# Patient Record
Sex: Female | Born: 1970 | Race: Black or African American | Hispanic: No | Marital: Single | Smoking: Never smoker
Health system: Southern US, Community
[De-identification: ages and names within clinical notes are randomized; demographics above are authoritative.]

## PROBLEM LIST (undated history)

## (undated) DIAGNOSIS — K649 Unspecified hemorrhoids: Secondary | ICD-10-CM

## (undated) DIAGNOSIS — R87619 Unspecified abnormal cytological findings in specimens from cervix uteri: Secondary | ICD-10-CM

## (undated) DIAGNOSIS — N946 Dysmenorrhea, unspecified: Secondary | ICD-10-CM

## (undated) DIAGNOSIS — B977 Papillomavirus as the cause of diseases classified elsewhere: Secondary | ICD-10-CM

## (undated) DIAGNOSIS — IMO0002 Reserved for concepts with insufficient information to code with codable children: Secondary | ICD-10-CM

## (undated) DIAGNOSIS — B009 Herpesviral infection, unspecified: Secondary | ICD-10-CM

## (undated) DIAGNOSIS — B9689 Other specified bacterial agents as the cause of diseases classified elsewhere: Secondary | ICD-10-CM

## (undated) DIAGNOSIS — A599 Trichomoniasis, unspecified: Secondary | ICD-10-CM

## (undated) DIAGNOSIS — N760 Acute vaginitis: Secondary | ICD-10-CM

## (undated) DIAGNOSIS — N9089 Other specified noninflammatory disorders of vulva and perineum: Secondary | ICD-10-CM

## (undated) DIAGNOSIS — N764 Abscess of vulva: Secondary | ICD-10-CM

## (undated) DIAGNOSIS — D219 Benign neoplasm of connective and other soft tissue, unspecified: Secondary | ICD-10-CM

## (undated) DIAGNOSIS — N920 Excessive and frequent menstruation with regular cycle: Secondary | ICD-10-CM

## (undated) HISTORY — DX: Benign neoplasm of connective and other soft tissue, unspecified: D21.9

## (undated) HISTORY — PX: TUBAL LIGATION: SHX77

## (undated) HISTORY — DX: Papillomavirus as the cause of diseases classified elsewhere: B97.7

## (undated) HISTORY — DX: Excessive and frequent menstruation with regular cycle: N92.0

## (undated) HISTORY — DX: Unspecified abnormal cytological findings in specimens from cervix uteri: R87.619

## (undated) HISTORY — DX: Dysmenorrhea, unspecified: N94.6

## (undated) HISTORY — DX: Other specified bacterial agents as the cause of diseases classified elsewhere: N76.0

## (undated) HISTORY — DX: Unspecified hemorrhoids: K64.9

## (undated) HISTORY — DX: Abscess of vulva: N76.4

## (undated) HISTORY — DX: Reserved for concepts with insufficient information to code with codable children: IMO0002

## (undated) HISTORY — DX: Herpesviral infection, unspecified: B00.9

## (undated) HISTORY — DX: Other specified bacterial agents as the cause of diseases classified elsewhere: B96.89

## (undated) HISTORY — DX: Trichomoniasis, unspecified: A59.9

## (undated) HISTORY — DX: Other specified noninflammatory disorders of vulva and perineum: N90.89

---

## 1997-11-11 DIAGNOSIS — N764 Abscess of vulva: Secondary | ICD-10-CM

## 1997-11-11 HISTORY — DX: Abscess of vulva: N76.4

## 1998-02-08 ENCOUNTER — Ambulatory Visit (HOSPITAL_COMMUNITY): Admission: RE | Admit: 1998-02-08 | Discharge: 1998-02-08 | Payer: Self-pay | Admitting: Family Medicine

## 1998-11-11 DIAGNOSIS — K649 Unspecified hemorrhoids: Secondary | ICD-10-CM

## 1998-11-11 HISTORY — DX: Unspecified hemorrhoids: K64.9

## 1999-04-12 ENCOUNTER — Other Ambulatory Visit: Admission: RE | Admit: 1999-04-12 | Discharge: 1999-04-12 | Payer: Self-pay | Admitting: Obstetrics and Gynecology

## 2001-12-11 ENCOUNTER — Other Ambulatory Visit: Admission: RE | Admit: 2001-12-11 | Discharge: 2001-12-11 | Payer: Self-pay | Admitting: Obstetrics and Gynecology

## 2002-06-03 ENCOUNTER — Emergency Department (HOSPITAL_COMMUNITY): Admission: EM | Admit: 2002-06-03 | Discharge: 2002-06-03 | Payer: Self-pay

## 2003-02-28 ENCOUNTER — Emergency Department (HOSPITAL_COMMUNITY): Admission: EM | Admit: 2003-02-28 | Discharge: 2003-03-01 | Payer: Self-pay | Admitting: Emergency Medicine

## 2005-09-18 ENCOUNTER — Other Ambulatory Visit: Admission: RE | Admit: 2005-09-18 | Discharge: 2005-09-18 | Payer: Self-pay | Admitting: Obstetrics and Gynecology

## 2005-11-14 ENCOUNTER — Ambulatory Visit (HOSPITAL_COMMUNITY): Admission: RE | Admit: 2005-11-14 | Discharge: 2005-11-14 | Payer: Self-pay | Admitting: Obstetrics and Gynecology

## 2006-02-19 ENCOUNTER — Inpatient Hospital Stay (HOSPITAL_COMMUNITY): Admission: AD | Admit: 2006-02-19 | Discharge: 2006-02-19 | Payer: Self-pay | Admitting: Obstetrics and Gynecology

## 2006-04-01 ENCOUNTER — Encounter (INDEPENDENT_AMBULATORY_CARE_PROVIDER_SITE_OTHER): Payer: Self-pay | Admitting: *Deleted

## 2006-04-01 ENCOUNTER — Inpatient Hospital Stay (HOSPITAL_COMMUNITY): Admission: AD | Admit: 2006-04-01 | Discharge: 2006-04-04 | Payer: Self-pay | Admitting: Obstetrics and Gynecology

## 2006-11-11 HISTORY — PX: OTHER SURGICAL HISTORY: SHX169

## 2007-04-07 ENCOUNTER — Ambulatory Visit (HOSPITAL_COMMUNITY): Admission: RE | Admit: 2007-04-07 | Discharge: 2007-04-09 | Payer: Self-pay | Admitting: Obstetrics and Gynecology

## 2007-04-07 ENCOUNTER — Encounter (INDEPENDENT_AMBULATORY_CARE_PROVIDER_SITE_OTHER): Payer: Self-pay | Admitting: Obstetrics and Gynecology

## 2007-08-30 ENCOUNTER — Encounter: Admission: RE | Admit: 2007-08-30 | Discharge: 2007-08-30 | Payer: Self-pay | Admitting: Internal Medicine

## 2010-03-23 ENCOUNTER — Encounter: Admission: RE | Admit: 2010-03-23 | Discharge: 2010-03-23 | Payer: Self-pay | Admitting: Obstetrics and Gynecology

## 2010-09-01 ENCOUNTER — Encounter: Admission: RE | Admit: 2010-09-01 | Discharge: 2010-09-01 | Payer: Self-pay | Admitting: Internal Medicine
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
## 2011-03-29 NOTE — Discharge Summary (Signed)
NAMEPARLEE, Leslie Burton            ACCOUNT NO.:  [id number]   MEDICAL RECORD NO.:  [id number]          PATIENT TYPE:  OIB   LOCATION:  9303                          FACILITY:  [facility]   PHYSICIAN:  Hal Morales, M.D.DATE OF BIRTH:  1971/10/21   DATE OF ADMISSION:  04/07/2007  DATE OF DISCHARGE:  04/09/2007                               DISCHARGE SUMMARY   DISCHARGE DIAGNOSIS:  Symptomatic uterine fibroids and menorrhagia.   OPERATION:  On the day of admission the patient underwent a  laparoscopically-assisted vaginal hysterectomy with uterine  morcellation, tolerating procedure well.  The patient was found to have  a uterus enlarged to 12 weeks size with multiple myomata with normal-  appearing tubes and ovaries.   HISTORY OF PRESENT ILLNESS:  Leslie Burton is a 40 year old single  African American female para 2-0-1-2 who presents for a laparoscopically-  assisted vaginal hysterectomy because of symptomatic uterine fibroids of  menorrhagia.  Please see the patient's dictated history and physical  examination for details.   PREOPERATIVE PHYSICAL EXAM:  VITAL SIGNS:  Blood pressure 120/80, weight  is 200, height is 5 feet 3-1/2 inches tall.  GENERAL:  Exam was within normal limits.  ABDOMEN:  There was a tender firm mass arising from the pelvis  approximately three fingerbreadths above the symphysis pubis.  PELVIC:  EGBUS was within normal limits.  Vagina was rugose.  Cervix was  nontender without lesions.  Uterus appeared 10-12 weeks size, irregular,  firm and tender.  Adnexa with no tenderness or masses.  Rectovaginal  exam was without any palpable tenderness or masses.   HOSPITAL COURSE:  On the day of admission the patient underwent  aforementioned procedure tolerating it well.  However, postoperatively  the patient developed negative pressure pulmonary edema and was  therefore sent to ICU for observation.  By postop day #1 the patient's  negative pressure pulmonary edema had  appeared to resolve clinically and  the patient was tolerating clear liquids.  The patient continued to  progress so that post by postop day #2 she had resumed bowel and bladder  function and was therefore ready for discharge home.   DISCHARGE LABS:  White blood count 8.9, hemoglobin 10.3, hematocrit  29.8, and platelets 186.   DISCHARGE MEDICATIONS:  1. Ibuprofen 600 mg with food every 6 hours for 3 days, then as needed      for pain.  2. Percocet 5/325 1 tablet every 4 hours as needed for pain/  3. Iron sulfate 1 tablet daily.   FOLLOW UP:  The patient is to call [phone] to schedule a 6 weeks  postoperative visit with Dr. Pennie Rushing.   DISCHARGE INSTRUCTIONS:  The patient was advised to call if she develops  any problems, for any severe pain, excessive bleeding or for temperature  greater than 100.5 degrees.  She was further advised to avoid driving  for 2 weeks, sexual activity for 6 weeks and she may shower.  She may  walk up steps.  Her diet was without restriction.   FINAL PATHOLOGY:  Uterus hysterectomy:  Uterine cervix - benign  ectocervical and endocervical mucosa; uterine  corpus - benign  proliferative endometrium and benign myometrium with multiple intramural  leiomyomata.      Elmira J. Adline Peals.      Hal Morales, M.D.  Electronically Signed    EJP/MEDQ  D:  05/16/2007  T:  05/16/2007  Job:  [id number]

## 2011-03-29 NOTE — Discharge Summary (Signed)
Leslie Burton, Leslie Burton            ACCOUNT NO.:  [id number]   MEDICAL RECORD NO.:  [id number]          PATIENT TYPE:  INP   LOCATION:  9132                          [facility]ACILITY:  [facility]   PHYSICIAN:  Leslie A. Dillard, M.D. DATE OF BIRTH:  1971/01/31   DATE OF ADMISSION:  04/01/2006  DATE OF DISCHARGE:                                 DISCHARGE SUMMARY   ADMITTING DIAGNOSES:  1.  Intrauterine pregnancy at 39 weeks.  2.  Previous cesarean section with desire for repeat.  3.  Desires tubal sterilization.   DISCHARGE DIAGNOSES:  1.  Intrauterine pregnancy at 39 weeks.  2.  Previous cesarean section with desire for repeat and sterilization.   PROCEDURES:  1.  Repeat low transverse cesarean section.  2.  Bilateral tubal ligation.  3.  Spinal anesthesia.   HOSPITAL COURSE:  Leslie Burton is a 40 year old gravida 2, para 1-0-0-1 at  25 weeks who presented for repeat cesarean section and bilateral tubal  sterilization.  Pregnancy had been remarkable for:  1.  First trimester spotting.  2.  Advanced maternal age.  3.  Previous cesarean section with desire for repeat.  4.  Desiring sterilization.  5.  Uterine fibroids.   The patient was taken to the operating room where a repeat low transverse  cesarean section was performed by Dr. Dierdre Forth.  Findings were a  viable female by the name of Jasmine, weight 8 pounds 13 ounces, Apgars were  9 and 9.  The patient tolerated the procedure well and was taken to recovery  in good condition.  Infant was taken to the full-term nursing.  By  postoperative day #1 the patient was doing well, her hemoglobin was 10.6,  white blood cell count was 8.9, and platelets were 162.  She was bottle  feeding.  Her physical exam was within normal limits.  The rest of her  hospital course was uncomplicated.  By postoperative day #3 she was doing  well, she was tolerating a regular diet, she had good bowel sounds, she had  a question regarding a possible mole on  her perineum.  This was evaluated  and was found to be just a prominent hair follicle.  The patient was  reassured.  Her incision was clean, dry, and intact with subcuticular  sutures noted.  Her extremities were within normal limits.  She was deemed  to have received the full benefit of her hospital stay and was discharged  home.   DISCHARGE INSTRUCTIONS:  Per [facility] handout.   DISCHARGE MEDICATIONS:  1.  Motrin 600 mg p.o. q.6h. p.r.n. pain.  2.  Tylox one to two p.o. q.3-4h. p.r.n. pain.   DISCHARGE FOLLOW-UP:  Will occur in 6 weeks at [facility].      Leslie Burton, C.N.M.      Leslie Burton, M.D.  Electronically Signed    VLL/MEDQ  D:  04/04/2006  T:  04/04/2006  Job:  [id number]

## 2011-03-29 NOTE — Op Note (Signed)
Leslie Burton, Leslie Burton            ACCOUNT NO.:  [id number]   MEDICAL RECORD NO.:  [id number]          PATIENT TYPE:  OIB   LOCATION:  9399                          [facility]ACILITY:  [facility]   PHYSICIAN:  Hal Morales, M.D.DATE OF BIRTH:  [birth year]   DATE OF PROCEDURE:  04/07/2007  DATE OF DISCHARGE:                               OPERATIVE REPORT   PREOPERATIVE DIAGNOSIS:  Menorrhagia, uterine fibroids.   POSTOPERATIVE DIAGNOSIS:  Menorrhagia, uterine fibroids.   OPERATION:  Laparoscopically assisted vaginal hysterectomy.   SURGEON:  Hal Morales, M.D.   FIRST ASSISTANT:  Osborn Coho, M.D.   ANESTHESIA:  General orotracheal.   ESTIMATED BLOOD LOSS:  1100 mL.   COMPLICATIONS:  None.   FINDINGS:  The uterus was enlarged to approximately 12 weeks size with  multiple myomata.  The ovaries and tubes appeared within normal limits.   SPECIMENS TO PATHOLOGY:  Morcellated uterus and cervix.  The uterus  weighed 233 grams.   PROCEDURE:  The patient was taken to the operating room, after  appropriate identification, and placed on the operating table.  After  the attainment of adequate general anesthesia she was placed in the  modified lithotomy position.  The perineum and vagina as well as the  abdomen were prepped with multiple layers of Betadine.  A Foley catheter  was inserted into the bladder and connected to straight drainage.  A  weighted speculum was placed in the vagina and a single tooth tenaculum  placed on the anterior cervix.  A Rumi uterine manipulator of 10 cm was  placed in the uterus and the balloon inflated with 7 mL of saline.  The  abdomen and perineum were then draped as a sterile field.  Subumbilical  and suprapubic injections of 0.25% Marcaine for a total of 10 mL was  undertaken.   A subumbilical incision was made and a Veress cannula placed through  that incision into the peritoneal cavity.  A pneumoperitoneum was  created with 1.5 liters of CO2.  The  Veress cannula was removed and  laparoscopic trocar placed through that incision into the peritoneal  cavity.  The laparoscope was placed through the trocar sleeve.  Suprapubic incisions were made approximately 5 cm above the symphysis  pubis and approximately 8 cm from the midline.  Laparoscopic probe  trocars were placed through these incisions into the perineal cavity  under direct visualization.  The right round ligament was then  identified and cauterized and then cut.  The right utero-ovarian  ligament was identified, cauterized and cut.  The right fallopian tube  at its cornual insertion was then cauterized and cut and hemostasis  achieved with cautery.  A similar procedure was carried out with the  round ligament, fallopian tube, and utero-ovarian ligament on the left  side.  Copious irrigation was carried out and hemostasis noted to be  adequate.  The anterior leaf of the broad ligament was then incised from  the left to right to allow for creation of a bladder flap.  Sharp  dissection off the anterior cervix allowed for further creation of the  bladder flap.  Copious  irrigation was carried out and hemostasis was  noted to be adequate.  All instruments were removed, however, the trocar  sleeves were left in place and the hemoperitoneum allowed to escape.   The perineum was then the site of operation and the legs were elevated.  A weighted speculum was placed in the posterior vagina and Lahey  tenaculum placed on the anterior and posterior surfaces of the cervix.  The cervicovaginal mucosa was injected with a dilute solution of  Pitressin for a total of 40 mL.  The cervix was then circumscribed and  the anterior vaginal mucosa dissected with a combination of blunt and  sharp dissection off the anterior cervix.  The posterior cul-de-sac was  entered sharply and the uterosacral ligaments on the right and left side  clamped, cut, suture ligated and those sutures held.  A long  weighted  speculum was then placed in the posterior cul-de-sac and the  paracervical tissues and parametrial tissues successively clamped, cut  and suture ligated.  The uterine arteries were clamped, cut and suture  ligated.  At this time, the uterine fundus would not fit through into  the pelvis and morcellation of the uterus was undertaken to allow  progress up to the level of the uterine fundus where the upper pedicles  had already been disconnected during the laparoscopic portion of the  procedure.  All morcellated portions of the uterus were removed from the  operative field.  Hemostasis was achieved in the pelvis with figure-of-  eight sutures of 0 Vicryl in the right and left pelvic sidewall  incorporating the vaginal mucosa and the pelvic peritoneum.  Hemostasis  was achieved in the posterior cuff by oversewing the vaginal cuff.   A McCall's culdoplasty suture was then placed incorporating the  uterosacral ligaments on either side and the intervening posterior  peritoneum.  Once hemostasis was noted to be adequate, the vaginal  angles were created with the sutures that were held from the uterosacral  angles and placed in the anterior vaginal mucosa and posterior vaginal  mucosa then tied down on the angle.  The remainder of the vaginal cuff  was then closed with interrupted sutures of 0 Vicryl incorporating  anterior vaginal mucosa, anterior peritoneum, posterior peritoneum, and  posterior vaginal mucosa in figure-of-eight sutures.  Once the cuff was  completely closed, hemostasis was noted to be adequate.  The McCall  culdoplasty suture was tied down and the vagina packed with 2 inch  packing moistened with Estrace cream.   At this time, the surgeon changed gloves and went above to recreate the  abdominal pneumoperitoneum.  The site of surgery was then inspected and no areas of bleeding were noted.  Approximately 100 mL of saline was  left in the pelvis and all instruments were  removed from the peritoneal  cavity under direct visualization as the CO2 was allowed to escape.  The  subumbilical incision was closed with a fascial suture of 0 Vicryl in a  figure-of-eight fashion and a subcuticular skin suture.  This was 3-0  Vicryl.  The suprapubic incisions were closed with subcuticular sutures  of 3-0 Vicryl.  Sterile dressings were applied and the patient awakened  from general anesthesia and taken to the recovery room in satisfactory  condition having tolerated the procedure well with sponge and instrument  counts correct.   SPECIMENS TO PATHOLOGY:  Morcellated uterus and cervix.      Hal Morales, M.D.  Electronically Signed     VPH/MEDQ  D:  04/07/2007  T:  04/07/2007  Job:  [id number]

## 2011-03-29 NOTE — Op Note (Signed)
NAMEAVAH, Leslie Burton            ACCOUNT NO.:  [id number]   MEDICAL RECORD NO.:  [id number]          PATIENT TYPE:  INP   LOCATION:  9132                          [facility]ACILITY:  [facility]   PHYSICIAN:  Hal Morales, M.D.DATE OF BIRTH:  06/19/1971   DATE OF PROCEDURE:  04/01/2006  DATE OF DISCHARGE:                                 OPERATIVE REPORT   PREOPERATIVE DIAGNOSES:  Intrauterine pregnancy at term, prior cesarean  section, desire for repeat cesarean section, desire for surgical  sterilization.   POSTOPERATIVE DIAGNOSES:  Intrauterine pregnancy at term, prior cesarean  section, desire for repeat cesarean section, desire for surgical  sterilization.   OPERATION:  Repeat low transverse cesarean section, bilateral tubal  sterilization.   SURGEON:  Hal Morales, M.D.   FIRST ASSISTANT:  Rica Koyanagi, C.N.M.   ANESTHESIA:  Spinal.   ESTIMATED BLOOD LOSS:  150 mL.   COMPLICATIONS:  None.   FINDINGS:  The patient was delivered of a female infant weighing 8 pounds 13  ounces with Apgars of 9 and 9 at 1 and 5 minutes respectively.  The uterus,  tubes and ovaries appeared normal for the gravid state.   PROCEDURE:  The patient was taken to the operating room after appropriate  identification and placed on the operating table.  After the placement of a  spinal anesthetic, she was placed in the supine position with a left lateral  tilt.  The abdomen and perineum were prepped with multiple layers of  Betadine and a Foley catheter inserted into the bladder under sterile  conditions and connected to straight drainage.  The abdomen was draped in a  sterile field.  After the assurance of adequate anesthesia, the suprapubic  region at the site of the previous cesarean section incision was infiltrated  with 20 mL of 0.25% Marcaine.  The abdomen was incised at the site and the  abdomen opened in layers.  The peritoneum was entered and the bladder blade  placed.  The uterus was  incised approximately 2 cm above the uterovesical  fold and that incision taken laterally on either side bluntly.  The  membranes were ruptured with the egress of clear fluid.  The infant was  delivered from the occiput transverse position and after having the nares  and pharynx suctioned and the cord clamped and cut, was handed off to the  awaiting pediatricians.  The placenta was allowed to spontaneously separate  from the uterus and was removed from the operative field for the collection  of routine cord blood as well as public cord blood banking per the patient's  wishes.  The uterine incision was closed with a running interlocking suture  of 0 Vicryl.  An imbricating suture of 0 Vicryl was then placed.  Hemostatic  sutures of 0 Vicryl in a figure-of-eight pattern were placed for adequate  hemostasis.  Copious irrigation was carried out.  The left fallopian tube  was identified, followed to its fimbriated end, then grasped at the isthmic  portion and elevated.  A suture of 2-0 chromic was placed through the  mesosalpinx and tied fore and aft  on the intervening descent knuckle of  tube.  A second ligature was placed proximal to that and the intervening  knuckle of tube excised.  The cut ends were cauterized.  A similar procedure  was carried out on the opposite side.  The portions of tube were removed  from the operative field.  The abdominal peritoneum was then copiously  irrigated and noted to be hemostatic.  It was then closed with a running  suture of 2-0 Vicryl.  The rectus muscles were then reapproximated with a  figure-of-eight suture of 2-0 Vicryl.  The rectus fascia was closed with a  running suture of 0 Vicryl then reinforced on either side of the midline  with figure-of-eight sutures of 0 Vicryl.  The subcutaneous tissue was  copiously irrigated, made hemostatic with Bovie cautery, and noted to be  hemostatic.  The skin incision was then closed with a subcuticular suture of   3-0 Monocryl.  A sterile dressing was applied and the patient taken from the  operating room to the recovery room in satisfactory condition having  tolerated the procedure well with sponge and instrument counts correct.  The  infant went to the full-term nursery.      Hal Morales, M.D.  Electronically Signed     VPH/MEDQ  D:  04/01/2006  T:  04/01/2006  Job:  [id number]
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
## 2011-04-02 ENCOUNTER — Other Ambulatory Visit: Payer: Self-pay | Admitting: Obstetrics and Gynecology

## 2011-04-02 DIAGNOSIS — Z1231 Encounter for screening mammogram for malignant neoplasm of breast: Secondary | ICD-10-CM

## 2011-04-11 ENCOUNTER — Ambulatory Visit: Payer: Self-pay

## 2011-04-30 ENCOUNTER — Ambulatory Visit: Payer: Self-pay

## 2011-05-23 ENCOUNTER — Ambulatory Visit: Payer: Self-pay

## 2011-05-23 ENCOUNTER — Ambulatory Visit
Admission: RE | Admit: 2011-05-23 | Discharge: 2011-05-23 | Disposition: A | Discharge status: Home / Self Care | Payer: PRIVATE HEALTH INSURANCE | Source: Ambulatory Visit | Attending: Obstetrics and Gynecology | Admitting: Obstetrics and Gynecology

## 2011-05-23 DIAGNOSIS — Z1231 Encounter for screening mammogram for malignant neoplasm of breast: Secondary | ICD-10-CM

## 2011-07-07 ENCOUNTER — Emergency Department (HOSPITAL_COMMUNITY)
Admission: EM | Admit: 2011-07-07 | Discharge: 2011-07-07 | Discharge status: Against Medical Advice | Payer: PRIVATE HEALTH INSURANCE | Attending: Emergency Medicine | Admitting: Emergency Medicine

## 2011-07-07 DIAGNOSIS — R079 Chest pain, unspecified: Secondary | ICD-10-CM | POA: Insufficient documentation

## 2011-07-07 DIAGNOSIS — R51 Headache: Secondary | ICD-10-CM | POA: Insufficient documentation

## 2011-07-07 DIAGNOSIS — R42 Dizziness and giddiness: Secondary | ICD-10-CM | POA: Insufficient documentation

## 2012-01-27 ENCOUNTER — Encounter (INDEPENDENT_AMBULATORY_CARE_PROVIDER_SITE_OTHER): Payer: Self-pay

## 2012-01-29 ENCOUNTER — Ambulatory Visit (INDEPENDENT_AMBULATORY_CARE_PROVIDER_SITE_OTHER): Payer: PRIVATE HEALTH INSURANCE | Admitting: General Surgery

## 2012-01-29 ENCOUNTER — Encounter (INDEPENDENT_AMBULATORY_CARE_PROVIDER_SITE_OTHER): Payer: Self-pay | Admitting: General Surgery

## 2012-01-29 VITALS — BP 122/88 | HR 60 | Temp 97.6°F | Resp 16 | Ht 64.0 in | Wt 197.5 lb

## 2012-01-29 DIAGNOSIS — K648 Other hemorrhoids: Secondary | ICD-10-CM | POA: Insufficient documentation

## 2012-01-29 DIAGNOSIS — K644 Residual hemorrhoidal skin tags: Secondary | ICD-10-CM | POA: Insufficient documentation

## 2012-01-29 NOTE — Progress Notes (Signed)
Patient ID: [name], female   DOB: July 05, 1971, 41 y.o.   MRN: [id number]  Chief Complaint  Patient presents with  . New Evaluation    eval of hemorrhoids     HPI Leslie Burton is a 41 y.o. female.   HPI Patient has a long history of hemorrhoids. More recently, she developed blood in the stool for 3 days and are well. She had no significant pain in that area. She occasionally does get swelling. She has not had significant constipation recently. Past Medical History  Diagnosis Date  . Hemorrhoid     History reviewed. No pertinent past surgical history.  History reviewed. No pertinent family history.  Social History History  Substance Use Topics  . Smoking status: Never Smoker   . Smokeless tobacco: Never Used  . Alcohol Use: No    No Known Allergies  No current outpatient prescriptions on file.    Review of Systems Review of Systems  Constitutional: Negative for fever, chills and unexpected weight change.  HENT: Negative for hearing loss, congestion, sore throat, trouble swallowing and voice change.   Eyes: Negative for visual disturbance.  Respiratory: Negative for cough and wheezing.   Cardiovascular: Negative for chest pain, palpitations and leg swelling.  Gastrointestinal: Positive for blood in stool. Negative for nausea, vomiting, abdominal pain, diarrhea, constipation, abdominal distention and anal bleeding.       See history of present illness  Genitourinary: Negative for hematuria, vaginal bleeding and difficulty urinating.  Musculoskeletal: Negative for arthralgias.  Skin: Negative for rash and wound.  Neurological: Negative for seizures, syncope and headaches.  Hematological: Negative for adenopathy. Does not bruise/bleed easily.  Psychiatric/Behavioral: Negative for confusion.    Blood pressure 122/88, pulse 60, temperature 97.6 F (36.4 C), temperature source Temporal, resp. rate 16, height 5\' 4"  (1.626 m), weight 197 lb 8 oz (89.585  kg).  Physical Exam Physical Exam  Constitutional: She appears well-developed.  Cardiovascular: Normal rate and normal heart sounds.   Pulmonary/Chest: Effort normal and breath sounds normal. No respiratory distress.  Abdominal: Soft. Bowel sounds are normal. She exhibits no distension. There is no tenderness. There is no rebound and no guarding.       External anal exam reveals large skin tags. Digital rectal exam reveals internal hemorrhoid on the left side. Sclerosing solution was injected up above the sensate area on the left side of the rectal mucosa, she tolerated this with mild local discomfort  Skin: Skin is warm and dry.    Data Reviewed   Assessment    Anal skin tags and inflamed internal hemorrhoid    Plan    Internal hemorrhoid was injected as above. I encouraged her to drink plenty of water and avoid constipation. I will see her back in 6 weeks.       Samaiyah Howes E 01/29/2012, 11:10 AM

## 2012-03-18 ENCOUNTER — Encounter (INDEPENDENT_AMBULATORY_CARE_PROVIDER_SITE_OTHER): Payer: PRIVATE HEALTH INSURANCE | Admitting: General Surgery

## 2012-03-23 ENCOUNTER — Ambulatory Visit: Payer: Self-pay | Admitting: Obstetrics and Gynecology

## 2012-05-15 ENCOUNTER — Other Ambulatory Visit: Payer: Self-pay | Admitting: Obstetrics and Gynecology

## 2012-05-15 DIAGNOSIS — Z1231 Encounter for screening mammogram for malignant neoplasm of breast: Secondary | ICD-10-CM

## 2012-05-28 ENCOUNTER — Ambulatory Visit
Admission: RE | Admit: 2012-05-28 | Discharge: 2012-05-28 | Disposition: A | Discharge status: Home / Self Care | Payer: PRIVATE HEALTH INSURANCE | Source: Ambulatory Visit | Attending: Obstetrics and Gynecology | Admitting: Obstetrics and Gynecology

## 2012-05-28 DIAGNOSIS — Z1231 Encounter for screening mammogram for malignant neoplasm of breast: Secondary | ICD-10-CM

## 2012-06-04 ENCOUNTER — Encounter: Payer: Self-pay | Admitting: Obstetrics and Gynecology

## 2012-06-09 ENCOUNTER — Encounter: Payer: Self-pay | Admitting: Obstetrics and Gynecology

## 2012-06-09 ENCOUNTER — Ambulatory Visit (INDEPENDENT_AMBULATORY_CARE_PROVIDER_SITE_OTHER): Payer: PRIVATE HEALTH INSURANCE | Admitting: Obstetrics and Gynecology

## 2012-06-09 VITALS — BP 112/64 | Ht 64.0 in | Wt 198.0 lb

## 2012-06-09 DIAGNOSIS — N9089 Other specified noninflammatory disorders of vulva and perineum: Secondary | ICD-10-CM

## 2012-06-09 DIAGNOSIS — N760 Acute vaginitis: Secondary | ICD-10-CM

## 2012-06-09 DIAGNOSIS — A599 Trichomoniasis, unspecified: Secondary | ICD-10-CM

## 2012-06-09 DIAGNOSIS — R87619 Unspecified abnormal cytological findings in specimens from cervix uteri: Secondary | ICD-10-CM

## 2012-06-09 DIAGNOSIS — A499 Bacterial infection, unspecified: Secondary | ICD-10-CM

## 2012-06-09 DIAGNOSIS — N764 Abscess of vulva: Secondary | ICD-10-CM

## 2012-06-09 DIAGNOSIS — D219 Benign neoplasm of connective and other soft tissue, unspecified: Secondary | ICD-10-CM

## 2012-06-09 DIAGNOSIS — B977 Papillomavirus as the cause of diseases classified elsewhere: Secondary | ICD-10-CM

## 2012-06-09 DIAGNOSIS — R6889 Other general symptoms and signs: Secondary | ICD-10-CM

## 2012-06-09 DIAGNOSIS — IMO0002 Reserved for concepts with insufficient information to code with codable children: Secondary | ICD-10-CM | POA: Insufficient documentation

## 2012-06-09 DIAGNOSIS — B009 Herpesviral infection, unspecified: Secondary | ICD-10-CM

## 2012-06-09 DIAGNOSIS — B9689 Other specified bacterial agents as the cause of diseases classified elsewhere: Secondary | ICD-10-CM

## 2012-06-09 DIAGNOSIS — K649 Unspecified hemorrhoids: Secondary | ICD-10-CM

## 2012-06-09 DIAGNOSIS — N946 Dysmenorrhea, unspecified: Secondary | ICD-10-CM

## 2012-06-09 DIAGNOSIS — N920 Excessive and frequent menstruation with regular cycle: Secondary | ICD-10-CM

## 2012-06-09 DIAGNOSIS — N898 Other specified noninflammatory disorders of vagina: Secondary | ICD-10-CM

## 2012-06-09 DIAGNOSIS — D259 Leiomyoma of uterus, unspecified: Secondary | ICD-10-CM

## 2012-06-09 LAB — POCT WET PREP (WET MOUNT): Clue Cells Wet Prep Whiff POC: POSITIVE

## 2012-06-09 NOTE — Patient Instructions (Addendum)
[place] [employer] [place] rep[employer]resh

## 2012-06-09 NOTE — Progress Notes (Signed)
AEX VISIT  Last Pap: 01/06/2008 WNL: Yes Regular Periods:no Contraception: BTL / Hysterectomy  Monthly Breast exam:yes Tetanus<63yrs:no Nl.Bladder Function:yes Daily BMs:yes Healthy Diet:yes Calcium:no Mammogram:yes Date of Mammogram: 05/2012 Exercise:yes Have often Exercise: once weekly Seatbelt: yes Abuse at home: no Stressful work:yes Sigmoid-colonoscopy: n/a Bone Density: No PCP: [facility]  Change in PMH: None Change in [facility]: None  Subjective:    Leslie Burton is a 41 y.o. female, [id number], who presents for an annual exam. She is without complaints    History   Social History  . Marital Status: Single    Spouse Name: N/A    Number of Children: N/A  . Years of Education: N/A   Social History Main Topics  . Smoking status: Never Smoker   . Smokeless tobacco: Never Used  . Alcohol Use: No  . Drug Use: No  . Sexually Active: Yes    Birth Control/ Protection: Surgical   Other Topics Concern  . None   Social History Narrative  . None    Menstrual cycle:   LMP: No LMP recorded. Patient has had a hysterectomy.           Cycle: none s/p hysterectomy  The following portions of the patient's history were reviewed and updated as appropriate: allergies, current medications, past family history, past medical history, past social history, past surgical history and problem list.  Review of Systems Pertinent items are noted in HPI. Breast:Negative for breast lump,nipple discharge or nipple retraction Gastrointestinal: Negative for abdominal pain, change in bowel habits or rectal bleeding Urinary:negative   Objective:    BP 112/64  Ht 5\' 4"  (1.626 m)  Wt 198 lb (89.812 kg)  BMI 33.99 kg/m2    Weight:  Wt Readings from Last 1 Encounters:  06/09/12 198 lb (89.812 kg)          BMI: Body mass index is 33.99 kg/(m^2).  General Appearance: Alert, appropriate appearance for age. No acute distress HEENT: Grossly normal Neck / Thyroid: Supple, no  masses, nodes or enlargement Lungs: clear to auscultation bilaterally Back: No CVA tenderness Breast Exam: No masses or nodes.No dimpling, nipple retraction or discharge. Cardiovascular: Regular rate and rhythm. S1, S2, no murmur Gastrointestinal: Soft, non-tender, no masses or organomegaly Pelvic Exam: External genitalia: normal general appearance Vaginal: normal rugae, discharge, white and vaginal vault, well suspended Cervix: removed surgically Adnexa: no masses Uterus: removed surgically Rectovaginal: normal rectal, no masses Lymphatic Exam: Non-palpable nodes in neck, clavicular, axillary, or inguinal regions Skin: no rash or abnormalities Neurologic: Normal gait and speech, no tremor  Psychiatric: Alert and oriented, appropriate affect.   Wet Prep:positive clue cells, pH 5.0 and positive whiff test Urinalysis:not applicable UPT: Not done   Assessment:    Recurrent Bacterial vaginosos  , currently asymptomatic   Plan:    mammogram return annually or prn Acidifying regimens reviewed with patient and over the counter options of [place], [place] [employer], or repHresh given       [facility] PMD

## 2013-03-17 ENCOUNTER — Ambulatory Visit (INDEPENDENT_AMBULATORY_CARE_PROVIDER_SITE_OTHER): Payer: Managed Care, Other (non HMO) | Admitting: Family Medicine

## 2013-03-17 VITALS — BP 114/76 | HR 79 | Temp 98.1°F | Resp 16 | Ht 65.0 in | Wt 206.0 lb

## 2013-03-17 DIAGNOSIS — N760 Acute vaginitis: Secondary | ICD-10-CM

## 2013-03-17 LAB — POCT WET PREP WITH KOH
KOH Prep POC: POSITIVE
Trichomonas, UA: NEGATIVE
Yeast Wet Prep HPF POC: NEGATIVE

## 2013-03-17 MED ORDER — FLUCONAZOLE 150 MG PO TABS: 150.0000 mg | ORAL_TABLET | Freq: Once | ORAL | Status: DC

## 2013-03-17 NOTE — Addendum Note (Signed)
Addended by: Elvina Sidle on: 03/17/2013 09:18 PM   Modules accepted: Orders, Level of Service

## 2013-03-17 NOTE — Progress Notes (Addendum)
42 yo woman who had consensual intercourse 3 days ago (condoms used).  She has had a couple days of labial irritation  No h/o latex allergy  Some discharge noted  Objective:  NAD External labia normal Introitus is reddened Vagina:  Scattered white exudates c/w monilia Cvx: normal  Assessment:  Monilia Plan: Vaginitis and vulvovaginitis - Plan: POCT Wet Prep with KOH, GC/Chlamydia Probe Amp, CANCELED: GC/Chlamydia Amp Probe, Genital  Signed, Elvina Sidle, Md

## 2013-03-17 NOTE — Patient Instructions (Addendum)
Monilial Vaginitis Vaginitis in a soreness, swelling and redness (inflammation) of the vagina and vulva. Monilial vaginitis is not a sexually transmitted infection. CAUSES  Yeast vaginitis is caused by yeast (candida) that is normally found in your vagina. With a yeast infection, the candida has overgrown in number to a point that upsets the chemical balance. SYMPTOMS   White, thick vaginal discharge.  Swelling, itching, redness and irritation of the vagina and possibly the lips of the vagina (vulva).  Burning or painful urination.  Painful intercourse. DIAGNOSIS  Things that may contribute to monilial vaginitis are:  Postmenopausal and virginal states.  Pregnancy.  Infections.  Being tired, sick or stressed, especially if you had monilial vaginitis in the past.  Diabetes. Good control will help lower the chance.  Birth control pills.  Tight fitting garments.  Using bubble bath, feminine sprays, douches or deodorant tampons.  Taking certain medications that kill germs (antibiotics).  Sporadic recurrence can occur if you become ill. TREATMENT  Your caregiver will give you medication.  There are several kinds of anti monilial vaginal creams and suppositories specific for monilial vaginitis. For recurrent yeast infections, use a suppository or cream in the vagina 2 times a week, or as directed.  Anti-monilial or steroid cream for the itching or irritation of the vulva may also be used. Get your caregiver's permission.  Painting the vagina with methylene blue solution may help if the monilial cream does not work.  Eating yogurt may help prevent monilial vaginitis. HOME CARE INSTRUCTIONS   Finish all medication as prescribed.  Do not have sex until treatment is completed or after your caregiver tells you it is okay.  Take warm sitz baths.  Do not douche.  Do not use tampons, especially scented ones.  Wear cotton underwear.  Avoid tight pants and panty  hose.  Tell your sexual partner that you have a yeast infection. They should go to their caregiver if they have symptoms such as mild rash or itching.  Your sexual partner should be treated as well if your infection is difficult to eliminate.  Practice safer sex. Use condoms.  Some vaginal medications cause latex condoms to fail. Vaginal medications that harm condoms are:  Cleocin cream.  Butoconazole (Femstat).  Terconazole (Terazol) vaginal suppository.  Miconazole (Monistat) (may be purchased over the counter). SEEK MEDICAL CARE IF:   You have a temperature by mouth above 102 F (38.9 C).  The infection is getting worse after 2 days of treatment.  The infection is not getting better after 3 days of treatment.  You develop blisters in or around your vagina.  You develop vaginal bleeding, and it is not your menstrual period.  You have pain when you urinate.  You develop intestinal problems.  You have pain with sexual intercourse. Document Released: 08/07/2005 Document Revised: 01/20/2012 Document Reviewed: 04/21/2009 ExitCare Patient Information 2013 [employer].  

## 2013-03-19 LAB — GC/CHLAMYDIA PROBE AMP
CT Probe RNA: NEGATIVE
GC Probe RNA: NEGATIVE

## 2014-11-29 ENCOUNTER — Other Ambulatory Visit (HOSPITAL_COMMUNITY): Payer: Self-pay | Admitting: Obstetrics and Gynecology

## 2014-11-29 DIAGNOSIS — Z1231 Encounter for screening mammogram for malignant neoplasm of breast: Secondary | ICD-10-CM

## 2014-12-13 ENCOUNTER — Other Ambulatory Visit (HOSPITAL_COMMUNITY): Payer: Self-pay | Admitting: Obstetrics and Gynecology

## 2014-12-13 ENCOUNTER — Ambulatory Visit (HOSPITAL_COMMUNITY)
Admission: RE | Admit: 2014-12-13 | Discharge: 2014-12-13 | Disposition: A | Discharge status: Home / Self Care | Payer: 59 | Source: Ambulatory Visit | Attending: Obstetrics and Gynecology | Admitting: Obstetrics and Gynecology

## 2014-12-13 DIAGNOSIS — Z1231 Encounter for screening mammogram for malignant neoplasm of breast: Secondary | ICD-10-CM | POA: Diagnosis not present

## 2014-12-15 ENCOUNTER — Other Ambulatory Visit: Payer: Self-pay | Admitting: Obstetrics and Gynecology

## 2014-12-15 DIAGNOSIS — R928 Other abnormal and inconclusive findings on diagnostic imaging of breast: Secondary | ICD-10-CM

## 2014-12-26 ENCOUNTER — Other Ambulatory Visit: Payer: PRIVATE HEALTH INSURANCE

## 2014-12-26 ENCOUNTER — Inpatient Hospital Stay: Admission: RE | Admit: 2014-12-26 | Payer: PRIVATE HEALTH INSURANCE | Source: Ambulatory Visit

## 2014-12-27 ENCOUNTER — Ambulatory Visit
Admission: RE | Admit: 2014-12-27 | Discharge: 2014-12-27 | Disposition: A | Discharge status: Home / Self Care | Payer: 59 | Source: Ambulatory Visit | Attending: Obstetrics and Gynecology | Admitting: Obstetrics and Gynecology

## 2014-12-27 DIAGNOSIS — R928 Other abnormal and inconclusive findings on diagnostic imaging of breast: Secondary | ICD-10-CM

## 2016-02-12 ENCOUNTER — Encounter (HOSPITAL_COMMUNITY): Payer: Self-pay | Admitting: Emergency Medicine

## 2016-02-12 ENCOUNTER — Emergency Department (HOSPITAL_COMMUNITY)
Admission: EM | Admit: 2016-02-12 | Discharge: 2016-02-12 | Disposition: A | Discharge status: Home / Self Care | Payer: BLUE CROSS/BLUE SHIELD | Source: Home / Self Care | Attending: Family Medicine | Admitting: Family Medicine

## 2016-02-12 DIAGNOSIS — M79644 Pain in right finger(s): Secondary | ICD-10-CM

## 2016-02-12 MED ORDER — CEPHALEXIN 500 MG PO CAPS: 500.0000 mg | ORAL_CAPSULE | Freq: Three times a day (TID) | ORAL | Status: DC

## 2016-02-12 NOTE — ED Notes (Signed)
Patient c/o right ring finger swelling around her nailbed. Patient reports she was concerned her nail was ingrown. Patient is in NAD.

## 2016-02-12 NOTE — ED Provider Notes (Signed)
CSN: [id number]     Arrival date & time 02/12/16  1952 History   First MD Initiated Contact with Patient 02/12/16 2035     Chief Complaint  Patient presents with  . Hand Pain   (Consider location/radiation/quality/duration/timing/severity/associated sxs/prior Treatment) HPI No known injury, Saturday, right index finger suddenly had a shock like pain through the nail. Soaked in peroxide but states that made it worse.  No other treatment Past Medical History  Diagnosis Date  . Hemorrhoid   . Vulvar lesion   . Labial abscess 1999    right  . BV (bacterial vaginosis)   . Hemorrhoids 2000  . Dysmenorrhea   . Menorrhagia   . Fibroids   . HSV-1 infection   . HSV-2 infection   . Trichomonas   . HPV (human papilloma virus) infection   . Abnormal pap    Past Surgical History  Procedure Laterality Date  . Tubal ligation    . Laparoscopically assisted vaginal hysterectomy  2008  . Cesarean section     Family History  Problem Relation Age of Onset  . Hypertension Mother   . Diabetes Father    Social History  Substance Use Topics  . Smoking status: Never Smoker   . Smokeless tobacco: Never Used  . Alcohol Use: Yes     Comment: occassionally   OB History    Gravida Para Term Preterm AB TAB SAB Ectopic Multiple Living   3 2   1 1    2      Review of Systems Finger pain Allergies  Review of patient's allergies indicates no known allergies.  Home Medications   Prior to Admission medications   Medication Sig Start Date End Date Taking? Authorizing Provider  cephALEXin (KEFLEX) 500 MG capsule Take 1 capsule (500 mg total) by mouth 3 (three) times daily. 02/12/16   Konrad Felix, PA  fluconazole (DIFLUCAN) 150 MG tablet Take 1 tablet (150 mg total) by mouth once. 03/17/13   Robyn Haber, MD   Meds Ordered and Administered this Visit  Medications - No data to display  BP 119/71 mmHg  Pulse 71  Temp(Src) 99.1 F (37.3 C) (Oral)  Resp 16  SpO2 100% No data  found.   Physical Exam NURSES NOTES AND VITAL SIGNS REVIEWED. CONSTITUTIONAL: Well developed, well nourished, no acute distress HEENT: normocephalic, atraumatic EYES: Conjunctiva normal NECK:normal ROM, supple, no adenopathy PULMONARY:No respiratory distress, normal effort MUSCULOSKELETAL: Normal ROM of all extremities, right index finger mildly swollen, tender to palpation, no sign of FB SKIN: warm and dry without rash PSYCHIATRIC: Mood and affect, behavior are normal   ED Course  Procedures (including critical care time)  Labs Review Labs Reviewed - No data to display  Imaging Review No results found.   Visual Acuity Review  Right Eye Distance:   Left Eye Distance:   Bilateral Distance:    Right Eye Near:   Left Eye Near:    Bilateral Near:       Pt is advised that finger may develop a pus pocket and will need an I&D There is not an indication for that procedure at this time.  Symptomatic treatment and keflex.   MDM   1. Finger pain, right     Patient is reassured that there are no issues that require transfer to higher level of care at this time or additional tests. Patient is advised to continue home symptomatic treatment. Patient is advised that if there are new or worsening symptoms to attend the  emergency department, contact primary care provider, or return to UC. Instructions of care provided discharged home in stable condition.    THIS NOTE WAS GENERATED USING A VOICE RECOGNITION SOFTWARE PROGRAM. ALL REASONABLE EFFORTS  WERE MADE TO PROOFREAD THIS DOCUMENT FOR ACCURACY.  I have verbally reviewed the discharge instructions with the patient. A printed AVS was given to the patient.  All questions were answered prior to discharge.      Konrad Felix, [place] 02/12/16 2129

## 2016-02-12 NOTE — Discharge Instructions (Signed)
Paronychia  °Paronychia is an infection of the skin. It happens near a fingernail or toenail. It may cause pain and swelling around the nail. Usually, it is not serious and it clears up with treatment. °HOME CARE °· Soak the fingers or toes in warm water as told by your doctor. You may be told to do this for 20 minutes, 2-3 times a day. °· Keep the area dry when you are not soaking it. °· Take medicines only as told by your doctor. °· If you were given an antibiotic medicine, finish all of it even if you start to feel better. °· Keep the affected area clean. °· Do not try to drain a fluid-filled bump yourself. °· Wear rubber gloves when putting your hands in water. °· Wear gloves if your hands might touch cleaners or chemicals. °· Follow your doctor's instructions about: °¨ Wound care. °¨ Bandage (dressing) changes and removal. °GET HELP IF: °· Your symptoms get worse or do not improve. °· You have a fever or chills. °· You have redness spreading from the affected area. °· You have more fluid, blood, or pus coming from the affected area. °· Your finger or knuckle is swollen or is hard to move. °  °This information is not intended to replace advice given to you by your health care provider. Make sure you discuss any questions you have with your health care provider. °  °Document Released: 10/16/2009 Document Revised: 03/14/2015 Document Reviewed: 10/05/2014 °Elsevier Interactive Patient Education ©2016 [employer]. ° °

## 2016-06-06 ENCOUNTER — Encounter: Payer: Self-pay | Admitting: Neurology

## 2016-06-06 ENCOUNTER — Ambulatory Visit (INDEPENDENT_AMBULATORY_CARE_PROVIDER_SITE_OTHER): Payer: BLUE CROSS/BLUE SHIELD | Admitting: Neurology

## 2016-06-06 VITALS — BP 120/80 | HR 80 | Resp 20 | Ht 64.0 in | Wt 211.0 lb

## 2016-06-06 DIAGNOSIS — G4700 Insomnia, unspecified: Secondary | ICD-10-CM | POA: Diagnosis not present

## 2016-06-06 DIAGNOSIS — G4730 Sleep apnea, unspecified: Secondary | ICD-10-CM

## 2016-06-06 DIAGNOSIS — G4710 Hypersomnia, unspecified: Secondary | ICD-10-CM | POA: Diagnosis not present

## 2016-06-06 DIAGNOSIS — R0683 Snoring: Secondary | ICD-10-CM | POA: Insufficient documentation

## 2016-06-06 DIAGNOSIS — G479 Sleep disorder, unspecified: Secondary | ICD-10-CM | POA: Insufficient documentation

## 2016-06-06 DIAGNOSIS — J322 Chronic ethmoidal sinusitis: Secondary | ICD-10-CM

## 2016-06-06 NOTE — Progress Notes (Signed)
[facility]   Provider:  Larey Seat, M D  Referring Provider: Merrilee Seashore, MD Primary Care Physician:  Merrilee Seashore, MD  Chief Complaint  Patient presents with  . New Patient (Initial Visit)    snores, never had sleep study    HPI:  Leslie Burton is a 45 y.o. female , seen here as a referral from Dr. Ashby Dawes for an evaluation of hypersomnia, Insomnia and snoring,   Mrs. Coor reports feeling sleepy during the daytime and most often if she had a poor night sleep before. She also feels sleepy after lunch frequently. When seen by Dr. Ashby Dawes the patient endorsed an Epworth sleepiness score of 15 points and also a quality of sleep questionnaire at 10 points. She is aware that she has snored for much of the last decade, but feels that this has Been worse. 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 Her office has access to natural daylight, she works between 8 AM and 5 PM. She has a sedentary office job.  Sleep habits are as follows: She usually leaves work soon after 5, picks up her daughter and either goes for a meal together or shops for the ingredients and cook at home. Most days dinner will be over by 8 PM. She does usually not consume caffeine with dinner, is also no alcohol consumed usually. Between 9:30 PM and 11:30 is her usual time to wind down and watch TV in the bedroom. The bedroom is cool, with a ceiling fan. Its not dark and quiet. She sleep alone, prefers the lateral position to sleep and uses 2 pillows for head support and one between the knees.  She often falls asleep with the TV on. She usually sleeps between 11 and 3 AM sometimes 4 AM and that is a bathroom break at that time. It is not the urge to urinate that wakes her, she is awake at this time frequently and then goes to the bathroom. She often stays awake  for an hour struggling to go back to sleep.  She rarely gets another hour of sleep and usually this is light sleep she feels more that she is drifting in and out of sleep. She feels that she gets about 10 minutes of sleep wakes up again, 10 minutes of sleep followed by waking up again, etc, her alarm rings at 5:45 AM and she will leave the bed by about 7 AM. Is a struggle for her to get up in the morning and she uses the snooze button several times. She lives alone with her 20 year old daughter, and sleeps alone. She commutes 15-20 minutes.  She has a coffee at work at 8.30 AM.   Sleep medical history and family sleep history:  Mother has OSA on CPAP, The patient has no childhood history of sleep walking , enuresis or night terrors.   Social history:  No tobacco use of any kind, rare ETOH, caffeine in form of coffee or sodas 2-4 a day. Single, 2 children 17 and 10.   Review of Systems: Out of a complete 14 system review, the patient complains of only the following symptoms, and all other reviewed systems are negative.  She endorsed at 3 points sitting and reading, watching television, sitting quietly after lunch. She also endorsed 2 points for sitting inactive in a public place, as a passenger in a car,  and when sitting and talking to someone. She endorsed fatigue, weight gain over the last 5 years, snoring, constipation rhinitis, allergies of the respiratory system, insomnia and daytime sleepiness.  Epworth score 17 , Fatigue severity score 43  , depression score 2/15    Social History   Social History  . Marital status: Single    Spouse name: N/A  . Number of children: N/A  . Years of education: N/A   Occupational History  . Not on file.   Social History Main Topics  . Smoking status: Never Smoker  . Smokeless tobacco: Never Used  . Alcohol use Yes     Comment: occassionally  . Drug use: No  . Sexual activity: Yes    Birth control/ protection: Surgical   Other Topics Concern    . Not on file   Social History Narrative  . No narrative on file    Family History  Problem Relation Age of Onset  . Hypertension Mother   . Diabetes Father     Past Medical History:  Diagnosis Date  . Abnormal pap   . BV (bacterial vaginosis)   . Dysmenorrhea   . Fibroids   . Hemorrhoid   . Hemorrhoids 2000  . HPV (human papilloma virus) infection   . HSV-1 infection   . HSV-2 infection   . Labial abscess 1999   right  . Menorrhagia   . Trichomonas   . Vulvar lesion     Past Surgical History:  Procedure Laterality Date  . CESAREAN SECTION    . laparoscopically assisted vaginal hysterectomy  2008  . TUBAL LIGATION      No current outpatient prescriptions on file.   No current facility-administered medications for this visit.     Allergies as of 06/06/2016  . (No Known Allergies)    Vitals: BP 120/80   Pulse 80   Resp 20   Ht 5\' 4"  (1.626 m)   Wt 211 lb (95.7 kg)   BMI 36.22 kg/m  Last Weight:  Wt Readings from Last 1 Encounters:  06/06/16 211 lb (95.7 kg)   [id number] mass index is 36.22 kg/m.     Last Height:   Ht Readings from Last 1 Encounters:  06/06/16 5\' 4"  (1.626 m)    Physical exam:  General: The patient is awake, alert and appears not in acute distress. The patient is well groomed. Head: Normocephalic, atraumatic. Neck is supple. Mallampati 4  neck circumference:15.5 . Nasal airflow  Patent  . Retrognathia is not seen.  Cardiovascular:  Regular rate and rhythm , without  murmurs or carotid bruit, and without distended neck veins. Respiratory: Lungs are clear to auscultation. Skin:  Without evidence of edema, or rash Trunk: BMI is elevated . The patient's posture is erect.   Neurologic exam : The patient is awake and alert, oriented to place and time.   Memory subjective  described as intact.  Attention span & concentration ability appears normal.  Speech is fluent,  without dysarthria, dysphonia or aphasia.  Mood and affect are  appropriate.  Cranial nerves: Reports  change in taste and smell associated with sinusitis.  Pupils are equal and briskly reactive to light. Funduscopic exam without evidence of pallor . Extraocular movements  in vertical and horizontal planes intact and without nystagmus. Visual fields by finger perimetry are intact.Hearing to finger rub intact.  Facial sensation intact to fine touch. Facial motor strength is symmetric and tongue and uvula move midline. Shoulder shrug was symmetrical.  Motor  exam:   Normal tone, muscle bulk and symmetric strength in all extremities. Sensory:  Fine touch, pinprick and vibration were normal.  Proprioception tested in the upper extremities was normal. Coordination: Rapid alternating movements in the fingers/hands was normal. Finger-to-nose maneuver  normal without evidence of ataxia, dysmetria or tremor.  Gait and station: Patient walks without assistive device and is able unassisted to climb up to the exam table. Strength within normal limits.  Stance is stable and normal.   Toe and heel stand were tested .Tandem gait is unfragmented. Turns with 3  Steps.   Deep tendon reflexes: in the  upper and lower extremities are symmetric and intact. Babinski maneuver response is downgoing.  The patient was advised of the nature of the diagnosed sleep disorder , the treatment options and risks for general a health and wellness arising from not treating the condition.  I spent more than 45 minutes of face to face time with the patient. Greater than 50% of time was spent in counseling and coordination of care. We have discussed the diagnosis and differential and I answered the patient's questions.     Assessment:  After physical and neurologic examination, review of laboratory studies,  Personal review of imaging studies, reports of other /same  Imaging studies ,  Results of polysomnography/ neurophysiology testing and pre-existing records as far as provided in visit., my  assessment is   1) Mrs. Jakubiak has been told that she snores has felt herself that she snoring and her sleep has been nonrestorative not refreshing. She struggles to get up in the morning yet her sleep quality is better and the first half of the night than in the second. She suffers from an early morning awakening and from there on her sleep is fragmented. What I would like to evaluate if if her snoring is associated with sleep apnea, and if his apnea is strictly positional. She has noted that she sleeps often in supine supported by 2 pillows and the sleep position may lead to more apnea when sleeping on the side or sleeping prone. She does not report sleep related headaches but frequently has a dry mouth in the morning. She has had recurrent bouts of sinusitis affecting her sense of smell and also forcing her to both bruits more, causing her voice to become more hoarse. She states that her allergies are all year present. She has frequent ethmoidal sinusitis. Risk  factor is high BMI over 35 , mallompati garde 4 . ,     Plan:  Treatment plan and additional workup : Split night polysomnography, no capnography.      Asencion Partridge Hewitt Garner MD  06/06/2016   CC: Merrilee Seashore, [address], [place] [id number]

## 2016-06-06 NOTE — Patient Instructions (Signed)
Please remember to try to maintain good sleep hygiene, which means: Keep a regular sleep and wake schedule, try not to exercise or have a meal within 2 hours of your bedtime, try to keep your bedroom conducive for sleep, that is, cool and dark, without light distractors such as an illuminated alarm clock, and refrain from watching TV right before sleep or in the middle of the night and do not keep the TV or radio on during the night. Also, try not to use or play on electronic devices at bedtime, such as your cell phone, tablet PC or laptop. If you like to read at bedtime on an electronic device, try to dim the background light as much as possible. Do not eat in the middle of the night.   We will request a sleep study.    We will look for leg twitching and snoring or sleep apnea.   For chronic insomnia, you are best followed by a psychiatrist and/or sleep psychologist.   We will call you with the sleep study results and make a follow up appointment if needed.        Hypersomnia Hypersomnia is when you feel extremely tired during the day even though you're getting plenty of sleep at night. You may need to take naps during the day, and you may also be extremely difficult to wake up when you are sleeping.  CAUSES  The cause of your hypersomnia may not be known. Hypersomnia may be caused by:   Medicines.  Sleep disorders, such as narcolepsy.  Trauma or injury to your head or nervous system.  Using drugs or alcohol.  Tumors.  Medical conditions, such as depression or hypothyroidism.  Genetics. SIGNS AND SYMPTOMS  The main symptoms of hypersomnia include:   Feeling extremely tired throughout the day.  Being very difficult to wake up.  Sleeping for longer and longer periods.  Taking naps throughout the day. Other symptoms may include:   Feeling:  Restless.  Annoyed.  Anxious.  Low energy.  Having difficulty:  Remembering.  Speaking.  Thinking.  Losing your  appetite.  Experiencing hallucinations. DIAGNOSIS  Hypersomnia may be diagnosed by:  Medical history and physical exam. This will include a sleep history.  Completing sleep logs.  Tests may also be done, such as:  Polysomnography.  Multiple sleep latency test (MSLT). TREATMENT  There is no cure for hypersomnia, but treatment can be very effective in helping manage the condition. Treatment may include:  Lifestyle and sleeping strategies to help cope with the condition.  Stimulant medicines.  Treating any underlying causes of hypersomnia. HOME CARE INSTRUCTIONS  Take medicines only as directed by your health care provider.  Schedule short naps for when you feel sleepiest during the day. Tell your employer or teachers that you have hypersomnia. You may be able to adjust your schedule to include time for naps.  Avoid drinking alcohol or caffeinated beverages.  Do not eat a heavy meal before bedtime. Eat at about the same times every day.  Do not drive or operate heavy machinery if you are sleepy.  Do not swim or go out on the water without a life jacket.  If possible, adjust your schedule so that you do not have to work or be active at night.  Keep all follow-up visits as directed by your health care provider. This is important. SEEK MEDICAL CARE IF:   You have new symptoms.  Your symptoms get worse. SEEK IMMEDIATE MEDICAL CARE IF:  You have serious  thoughts of hurting yourself or someone else.   This information is not intended to replace advice given to you by your health care provider. Make sure you discuss any questions you have with your health care provider.   Document Released: 10/18/2002 Document Revised: 11/18/2014 Document Reviewed: 06/02/2014 Elsevier Interactive Patient Education [employer].

## 2016-07-19 ENCOUNTER — Ambulatory Visit (INDEPENDENT_AMBULATORY_CARE_PROVIDER_SITE_OTHER): Payer: BLUE CROSS/BLUE SHIELD | Admitting: Neurology

## 2016-07-19 DIAGNOSIS — G4700 Insomnia, unspecified: Secondary | ICD-10-CM

## 2016-07-19 DIAGNOSIS — G479 Sleep disorder, unspecified: Secondary | ICD-10-CM

## 2016-07-19 DIAGNOSIS — G4710 Hypersomnia, unspecified: Secondary | ICD-10-CM | POA: Diagnosis not present

## 2016-07-19 DIAGNOSIS — R0683 Snoring: Secondary | ICD-10-CM

## 2016-08-05 ENCOUNTER — Telehealth: Payer: Self-pay

## 2016-08-05 DIAGNOSIS — G4733 Obstructive sleep apnea (adult) (pediatric): Secondary | ICD-10-CM

## 2016-08-05 NOTE — Telephone Encounter (Signed)
I spoke to pt. I advised her that her sleep study revealed moderate sleep apnea and Dr. Brett Fairy recommends treatment of her sleep apnea via cpap and therefore recommends a full night, attended, cpap titration study. I advised pt to lose weight, diet, and exercise if not contraindicated by her other physicians. Pt is agreeable to a cpap titration study. Pt knows that our sleep lab will call her to schedule it. Pt verbalized understanding of results. Pt had no questions at this time but was encouraged to call back if questions arise.

## 2016-09-06 ENCOUNTER — Ambulatory Visit (INDEPENDENT_AMBULATORY_CARE_PROVIDER_SITE_OTHER): Payer: BLUE CROSS/BLUE SHIELD | Admitting: Neurology

## 2016-09-06 DIAGNOSIS — G4733 Obstructive sleep apnea (adult) (pediatric): Secondary | ICD-10-CM | POA: Diagnosis not present

## 2016-09-10 ENCOUNTER — Encounter: Payer: Self-pay | Admitting: *Deleted

## 2016-09-11 ENCOUNTER — Telehealth: Payer: Self-pay

## 2016-09-11 DIAGNOSIS — G4733 Obstructive sleep apnea (adult) (pediatric): Secondary | ICD-10-CM

## 2016-09-11 NOTE — Telephone Encounter (Signed)
I spoke to pt. I advised her that her cpap titration showed that her osa responded well to cpap therapy at 7 cm H2O. Dr. Brett Fairy recommends that pt start a CPAP. Pt is agreeable to this. I advised pt that we would send the order for her cpap to a DME and they will call her within a week to get the cpap set up. Will send to [facility]. A follow up appt was made with pt for 11/26/16 at 1:30 pm. Pt verbalized understanding of results. Pt had no questions at this time but was encouraged to call back if questions arise.   Order for cpap not placed- will send to Dr. Brett Fairy for review.

## 2016-09-11 NOTE — Telephone Encounter (Signed)
Referral faxed to [facility].

## 2016-11-26 ENCOUNTER — Ambulatory Visit: Payer: Self-pay | Admitting: Neurology

## 2017-06-25 ENCOUNTER — Telehealth: Payer: Self-pay | Admitting: Neurology

## 2017-06-25 NOTE — Telephone Encounter (Signed)
Per [facility] the patient has been non compliant and so therefore insurance is getting her machine. "Leslie Burton she is non compliant and insurance is forcing a pick up on her" There is no follow up apt made for her.

## 2017-07-02 NOTE — Telephone Encounter (Signed)
Patient calling to discuss using CPAP machine again.

## 2017-07-04 NOTE — Telephone Encounter (Signed)
Spoke with the patient pt is aware that she will have to start the process all over in order to proceed forward with getting on CPAP. Pt has an apt scheduled for Monday and we will discuss this further at the apt to determine what steps we should take.

## 2017-07-07 ENCOUNTER — Encounter: Payer: Self-pay | Admitting: Neurology

## 2017-07-07 ENCOUNTER — Ambulatory Visit (INDEPENDENT_AMBULATORY_CARE_PROVIDER_SITE_OTHER): Payer: BLUE CROSS/BLUE SHIELD | Admitting: Neurology

## 2017-07-07 VITALS — BP 120/84 | HR 68 | Ht 65.0 in | Wt 218.0 lb

## 2017-07-07 DIAGNOSIS — Z9114 Patient's other noncompliance with medication regimen: Secondary | ICD-10-CM | POA: Diagnosis not present

## 2017-07-07 DIAGNOSIS — G4733 Obstructive sleep apnea (adult) (pediatric): Secondary | ICD-10-CM | POA: Diagnosis not present

## 2017-07-07 DIAGNOSIS — E669 Obesity, unspecified: Secondary | ICD-10-CM

## 2017-07-07 DIAGNOSIS — Z91199 Patient's noncompliance with other medical treatment and regimen due to unspecified reason: Secondary | ICD-10-CM | POA: Insufficient documentation

## 2017-07-07 NOTE — Progress Notes (Signed)
SLEEP MEDICINE CLINIC   Provider:  Larey Seat, M D  Referring Provider: Merrilee Seashore, MD Primary Care Physician:  Merrilee Seashore, MD  Chief Complaint  Patient presents with  . Follow-up    wants to discuss getting a new cpap    HPI:  Leslie Burton is a 46 y.o. female , seen here as a referral from Dr. Ashby Dawes for an evaluation of hypersomnia, Insomnia and snoring,  Interval history from 07/07/2017, I see Leslie Burton today, following a baseline polysomnography from 07/19/2016 with the patient was diagnosed with an AHI at 30.6 per hour, REM AHI was 69.7Carmen Braden Burton, M.D. but there were no significant oxygen desaturations. Due to the REM dependent sleep apnea and the severity of sleep apnea a CPAP titration was initiated and performed on 09/06/2016. Her AHI was reduced to 1.1 per hour of sleep under only 7 cm water pressure. She used the [employer]. 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 The patient's 30 and 90 day downloads indicated that she was not compliant and her medical equipment company, [employer], stated that they will pick up the machine in August earlier this month. The patient states that she falls asleep on her couch before going to the bedroom, waking up at 2 or 3 in the morning and then not being able to get enough hours in. The patient has remained untreated or undertreated, reflected in an Epworth sleepiness score of 17 points, fatigue severity of 49 points. She is here today to repeat the sleep study that she can requalify for CPAP.      CD _ 2017 Leslie Burton reports feeling sleepy during the daytime and most often if she had a poor night sleep before. She also feels sleepy after lunch frequently. When seen by Dr. Ashby Dawes the patient endorsed an Epworth sleepiness score of 15 points and also a quality of sleep questionnaire at 10 points. She is aware that she has snored for much of the last decade, but feels that this has Been  worse. 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 Her office has access to natural daylight, she works between 8 AM and 5 PM. She has a sedentary office job.  Sleep habits are as follows: She usually leaves work soon after 5, picks up her daughter and either goes for a meal together or shops for the ingredients and cook at home. Most days dinner will be over by 8 PM. She does usually not consume caffeine with dinner, is also no alcohol consumed usually. Between 9:30 PM and 11:30 is her usual time to wind down and watch TV in the bedroom. The bedroom is cool, with a ceiling fan. Its not dark and quiet. She sleep alone, prefers the lateral position to sleep and uses 2 pillows for head support and one between the knees.  She often falls asleep with the TV on. She usually sleeps between 11 and 3 AM sometimes 4 AM and that is a bathroom break at that time. It is not the urge to urinate that wakes her, she is awake at this time frequently and then goes to the bathroom. She often stays awake for an hour struggling to go back to sleep.  She rarely gets another hour of sleep and usually this is light sleep she feels more that she is drifting in and out of sleep. She feels that she  gets about 10 minutes of sleep wakes up again, 10 minutes of sleep followed by waking up again, etc, her alarm rings at 5:45 AM and she will leave the bed by about 7 AM. Is a struggle for her to get up in the morning and she uses the snooze button several times. She lives alone with her 48 year old daughter, and sleeps alone. She commutes 15-20 minutes.  She has a coffee at work at 8.30 AM.  Sleep medical history and family sleep history:  Mother has OSA on CPAP, The patient has no childhood history of sleep walking , enuresis or night terrors.  Social history:  No tobacco use of any kind, rare ETOH, caffeine in form of  coffee or sodas 2-4 a day. Single, 2 children 17 and 10.   Leslie Burton has been told that she snores has felt herself that she snoring and her sleep has been nonrestorative not refreshing. She struggles to get up in the morning yet her sleep quality is better and the first half of the night than in the second. She suffers from an early morning awakening and from there on her sleep is fragmented. What I would like to evaluate if if her snoring is associated with sleep apnea, and if his apnea is strictly positional. She has noted that she sleeps often in supine supported by 2 pillows and the sleep position may lead to more apnea when sleeping on the side or sleeping prone. She does not report sleep related headaches but frequently has a dry mouth in the morning. She has had recurrent bouts of sinusitis affecting her sense of smell and also forcing her to both bruits more, causing her voice to become more hoarse. She states that her allergies are all year present. She has frequent ethmoidal sinusitis. Risk  factor is high BMI over 35 , mallompati garde 4 . ,     Review of Systems: Out of a complete 14 system review, the patient complains of only the following symptoms, and all other reviewed systems are negative.  She endorsed at 3 points sitting and reading, watching television, sitting quietly after lunch. She also endorsed 2 points for sitting inactive in a public place, as a passenger in a car, and when sitting and talking to someone. She endorsed fatigue, weight gain over the last 5 years, snoring, constipation rhinitis, allergies of the respiratory system, insomnia and daytime sleepiness.  Epworth score 17 , Fatigue severity score 43  , depression score 2/15    Social History   Social History  . Marital status: Single    Spouse name: N/A  . Number of children: N/A  . Years of education: N/A   Occupational History  . Not on file.   Social History Main Topics  . Smoking status: Never  Smoker  . Smokeless tobacco: Never Used  . Alcohol use Yes     Comment: occassionally  . Drug use: No  . Sexual activity: Yes    Birth control/ protection: Surgical   Other Topics Concern  . Not on file   Social History Narrative  . No narrative on file    Family History  Problem Relation Age of Onset  . Hypertension Mother   . Diabetes Father     Past Medical History:  Diagnosis Date  . Abnormal pap   . BV (bacterial vaginosis)   . Dysmenorrhea   . Fibroids   . Hemorrhoid   . Hemorrhoids 2000  . HPV (human papilloma  virus) infection   . HSV-1 infection   . HSV-2 infection   . Labial abscess 1999   right  . Menorrhagia   . Trichomonas   . Vulvar lesion     Past Surgical History:  Procedure Laterality Date  . CESAREAN SECTION    . laparoscopically assisted vaginal hysterectomy  2008  . TUBAL LIGATION      Current Outpatient Prescriptions  Medication Sig Dispense Refill  . diclofenac (VOLTAREN) 75 MG EC tablet Take 75 mg by mouth 2 (two) times daily.  1   No current facility-administered medications for this visit.     Allergies as of 07/07/2017  . (No Known Allergies)    Vitals: BP 120/84   Pulse 68   Ht 5\' 5"  (1.651 m)   Wt 218 lb (98.9 kg)   BMI 36.28 kg/m  Last Weight:  Wt Readings from Last 1 Encounters:  07/07/17 218 lb (98.9 kg)   PIR:JJOA mass index is 36.28 kg/m.     Last Height:   Ht Readings from Last 1 Encounters:  07/07/17 5\' 5"  (1.651 m)    Physical exam:  General: The patient is awake, alert and appears not in acute distress. The patient is well groomed. Head: Normocephalic, atraumatic. Neck is supple. Mallampati 4  neck circumference:15.5 . Nasal airflow  Patent  . Retrognathia is not seen.  Cardiovascular:  Regular rate and rhythm , without  murmurs or carotid bruit, and without distended neck veins. Respiratory: Lungs are clear to auscultation. Trunk: BMI is elevated . The patient's posture is erect.   Neurologic exam  : The patient is awake and alert, oriented to place and time.    Cranial nerves: Reports  change in taste and smell associated with sinusitis, ethmoidal .  Pupils are equally and briskly reactive to light.  Visual fields by finger perimetry are intact.Hearing to finger rub intact.  Facial sensation intact to fine touch. Facial motor strength is symmetric and tongue and uvula move midline. Shoulder shrug was symmetrical.  Motor exam:   Normal tone, muscle bulk and symmetric strength in all extremities. Sensory:  Fine touch, pinprick and vibration were normal.  Proprioception tested in the upper extremities was normal. Coordination: Rapid alternating movements in the fingers/hands was normal. Finger-to-nose maneuver  normal without evidence of ataxia, dysmetria or tremor.   The patient was advised of the nature of the diagnosed sleep disorder , the treatment options and risks for general a health and wellness arising from not treating the condition.  I spent more than 45 minutes of face to face time with the patient. Greater than 50% of time was spent in counseling and coordination of care. We have discussed the diagnosis and differential and I answered the patient's questions.     Assessment:  After physical and neurologic examination, review of laboratory studies,  Personal review of imaging studies, reports of other /same  Imaging studies ,  Results of polysomnography/ neurophysiology testing and pre-existing records as far as provided in visit., my assessment is   1)  She has severe REM dependent OSA - The patient's 30 and 90 day downloads indicated that she was not compliant and her medical equipment company, [employer], stated that they will pick up the machine in August earlier this month. The patient states that she falls asleep on her couch before going to the bedroom, waking up at 2 or 3 in the morning and then not being able to get enough hours in. The patient has  remained untreated or  undertreated, reflected in an Epworth sleepiness score of 17 points, fatigue severity of 49 points. She is here today to repeat the sleep study that she can requalify for CPAP.   Plan:  Treatment plan and additional workup : Split night polysomnography, no capnography.      Asencion Partridge Jihad Brownlow MD  07/07/2017   CC: Merrilee Seashore, [address], [place] [id number]

## 2017-07-21 ENCOUNTER — Other Ambulatory Visit: Payer: Self-pay | Admitting: Neurology

## 2017-07-21 DIAGNOSIS — G4733 Obstructive sleep apnea (adult) (pediatric): Secondary | ICD-10-CM

## 2018-06-14 ENCOUNTER — Other Ambulatory Visit: Payer: Self-pay

## 2018-06-14 ENCOUNTER — Ambulatory Visit (HOSPITAL_COMMUNITY)
Admission: EM | Admit: 2018-06-14 | Discharge: 2018-06-14 | Disposition: A | Discharge status: Home / Self Care | Payer: BLUE CROSS/BLUE SHIELD | Attending: Family Medicine | Admitting: Family Medicine

## 2018-06-14 ENCOUNTER — Encounter (HOSPITAL_COMMUNITY): Payer: Self-pay | Admitting: Emergency Medicine

## 2018-06-14 DIAGNOSIS — R1031 Right lower quadrant pain: Secondary | ICD-10-CM | POA: Diagnosis not present

## 2018-06-14 NOTE — Discharge Instructions (Signed)
Given RLQ pain with nausea, please go to the emergency department for further evaluation.

## 2018-06-14 NOTE — ED Triage Notes (Signed)
The patient presented to the [facility] with a complaint of upper right abdominal pain and nausea x 1 day.

## 2018-06-14 NOTE — ED Provider Notes (Signed)
[place]    CSN: [id number] Arrival date & time: 06/14/18  1504     History   Chief Complaint Chief Complaint  Patient presents with  . Abdominal Pain    HPI Leslie Burton is a 47 y.o. female.   47 year old female comes in for 1 day history of right lower quadrant pain.  States started late last night.  Stabbing pain that is constant without any aggravating or alleviating factor.  Was able to eat and drink slightly this morning, though with nausea.  Denies vomiting.  Denies diarrhea.  Last bowel movement yesterday without straining.  Denies fever, chills, night sweats.  Denies urinary symptoms such as frequency, dysuria, hematuria.  Denies vaginal discharge, itching, pain.  She has a history of hysterectomy.     Past Medical History:  Diagnosis Date  . Abnormal pap   . BV (bacterial vaginosis)   . Dysmenorrhea   . Fibroids   . Hemorrhoid   . Hemorrhoids 2000  . HPV (human papilloma virus) infection   . HSV-1 infection   . HSV-2 infection   . Labial abscess 1999   right  . Menorrhagia   . Trichomonas   . Vulvar lesion     Patient Active Problem List   Diagnosis Date Noted  . OSA (obstructive sleep apnea) 07/07/2017  . Poor compliance with CPAP treatment 07/07/2017  . Obesity (BMI 35.0-39.9 without comorbidity) 07/07/2017  . Insomnia 06/06/2016  . Snoring 06/06/2016  . Sleeping difficulties 06/06/2016  . 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 Chronic ethmoidal sinusitis 06/06/2016  . Hemorrhoid   . Vulvar lesion   . Labial abscess   . BV (bacterial vaginosis)   . Hemorrhoids   . Dysmenorrhea   . Menorrhagia   . Fibroids   . HSV-1 infection   . HSV-2 infection   . Trichomonas   . HPV (human papilloma virus) infection   . Abnormal pap   . Internal hemorrhoid 01/29/2012  . Anal skin tag 01/29/2012    Past Surgical History:  Procedure Laterality Date  . CESAREAN SECTION    . laparoscopically assisted vaginal hysterectomy  2008    . TUBAL LIGATION      OB History    Gravida  3   Para  2   Term      Preterm      AB  1   Living  2     SAB      TAB  1   Ectopic      Multiple      Live Births  2            Home Medications    Prior to Admission medications   Not on File    Family History Family History  Problem Relation Age of Onset  . Hypertension Mother   . Diabetes Father     Social History Social History   Tobacco Use  . Smoking status: Never Smoker  . Smokeless tobacco: Never Used  Substance Use Topics  . Alcohol use: Yes    Comment: occassionally  . Drug use: No     Allergies   Patient has no known allergies.   Review of Systems Review of Systems  Reason unable to perform ROS: See HPI as above.     Physical Exam Triage Vital Signs ED Triage Vitals [06/14/18 1531]  Enc Vitals Group     BP 128/80     Pulse Rate 77  Resp 18     Temp 98.9 F (37.2 C)     Temp Source Oral     SpO2 100 %     Weight      Height      Head Circumference      Peak Flow      Pain Score 8     Pain Loc      Pain Edu?      Excl. in [place]?    No data found.  Updated Vital Signs BP 128/80 (BP Location: Left Arm)   Pulse 77   Temp 98.9 F (37.2 C) (Oral)   Resp 18   SpO2 100%   Physical Exam  Constitutional: She is oriented to person, place, and time. She appears well-developed and well-nourished.  Non-toxic appearance. She does not appear ill. No distress.  Patient holding abdomen when moving to the exam table.  HENT:  Head: Normocephalic and atraumatic.  Eyes: Pupils are equal, round, and reactive to light. Conjunctivae are normal.  Cardiovascular: Normal rate, regular rhythm and normal heart sounds. Exam reveals no gallop and no friction rub.  No murmur heard. Pulmonary/Chest: Effort normal and breath sounds normal. She has no wheezes. She has no rales.  Abdominal: Soft. Bowel sounds are normal. She exhibits no mass. There is no rigidity and no CVA tenderness.   Patient tender to palpation of right lower quadrant without obvious rebound or guarding.  Positive McBurney's point.  Negative Rovsing's, obturator, psoas sign.  Neurological: She is alert and oriented to person, place, and time.  Skin: Skin is warm and dry.  Psychiatric: She has a normal mood and affect. Her behavior is normal. Judgment normal.    UC Treatments / Results  Labs (all labs ordered are listed, but only abnormal results are displayed) Labs Reviewed - No data to display  EKG None  Radiology No results found.  Procedures Procedures (including critical care time)  Medications Ordered in UC Medications - No data to display  Initial Impression / Assessment and Plan / UC Course  I have reviewed the triage vital signs and the nursing notes.  Pertinent labs & imaging results that were available during my care of the patient were reviewed by me and considered in my medical decision making (see chart for details).    47 year old female with acute onset of right lower quadrant pain.  Patient is nontoxic in appearance, however holding abdomen when walking and moving.  She has right lower quadrant tenderness without obvious rebound or guarding. Given RLQ pain, will discharge in stable condition to the ED for further evaluation.   Final Clinical Impressions(s) / UC Diagnoses   Final diagnoses:  Right lower quadrant abdominal pain    ED Prescriptions    None        Ok Edwards, PA-C 06/14/18 1646

## 2018-06-17 ENCOUNTER — Other Ambulatory Visit (HOSPITAL_COMMUNITY): Payer: Self-pay | Admitting: Internal Medicine

## 2018-06-17 DIAGNOSIS — R198 Other specified symptoms and signs involving the digestive system and abdomen: Secondary | ICD-10-CM

## 2018-06-17 DIAGNOSIS — R109 Unspecified abdominal pain: Secondary | ICD-10-CM

## 2018-06-17 DIAGNOSIS — R1011 Right upper quadrant pain: Secondary | ICD-10-CM

## 2018-06-18 ENCOUNTER — Encounter (HOSPITAL_COMMUNITY): Payer: Self-pay

## 2018-06-18 ENCOUNTER — Ambulatory Visit (HOSPITAL_COMMUNITY)
Admission: RE | Admit: 2018-06-18 | Discharge: 2018-06-18 | Disposition: A | Discharge status: Home / Self Care | Payer: BLUE CROSS/BLUE SHIELD | Source: Ambulatory Visit | Attending: Internal Medicine | Admitting: Internal Medicine

## 2018-06-18 DIAGNOSIS — R198 Other specified symptoms and signs involving the digestive system and abdomen: Secondary | ICD-10-CM

## 2018-06-18 DIAGNOSIS — J439 Emphysema, unspecified: Secondary | ICD-10-CM | POA: Insufficient documentation

## 2018-06-18 DIAGNOSIS — R1011 Right upper quadrant pain: Secondary | ICD-10-CM

## 2018-06-18 DIAGNOSIS — N132 Hydronephrosis with renal and ureteral calculous obstruction: Secondary | ICD-10-CM | POA: Insufficient documentation

## 2018-06-18 DIAGNOSIS — R1031 Right lower quadrant pain: Secondary | ICD-10-CM | POA: Insufficient documentation

## 2018-06-18 DIAGNOSIS — M4184 Other forms of scoliosis, thoracic region: Secondary | ICD-10-CM | POA: Insufficient documentation

## 2018-06-18 DIAGNOSIS — R109 Unspecified abdominal pain: Secondary | ICD-10-CM

## 2018-06-18 DIAGNOSIS — Z90710 Acquired absence of both cervix and uterus: Secondary | ICD-10-CM | POA: Insufficient documentation

## 2018-06-18 MED ORDER — IOHEXOL 300 MG/ML  SOLN
100.0000 mL | Freq: Once | INTRAMUSCULAR | Status: AC | PRN
  Administered 2018-06-18: 100 mL via INTRAVENOUS

## 2018-07-27 ENCOUNTER — Other Ambulatory Visit: Payer: Self-pay | Admitting: Obstetrics and Gynecology

## 2018-07-27 DIAGNOSIS — R921 Mammographic calcification found on diagnostic imaging of breast: Secondary | ICD-10-CM

## 2018-07-31 ENCOUNTER — Other Ambulatory Visit: Payer: BLUE CROSS/BLUE SHIELD

## 2018-08-03 ENCOUNTER — Other Ambulatory Visit: Payer: Self-pay | Admitting: Obstetrics and Gynecology

## 2018-08-03 ENCOUNTER — Ambulatory Visit: Payer: BLUE CROSS/BLUE SHIELD

## 2018-08-03 ENCOUNTER — Ambulatory Visit
Admission: RE | Admit: 2018-08-03 | Discharge: 2018-08-03 | Disposition: A | Discharge status: Home / Self Care | Payer: BLUE CROSS/BLUE SHIELD | Source: Ambulatory Visit | Attending: Obstetrics and Gynecology | Admitting: Obstetrics and Gynecology

## 2018-08-03 DIAGNOSIS — R921 Mammographic calcification found on diagnostic imaging of breast: Secondary | ICD-10-CM

## 2018-10-14 DIAGNOSIS — G4733 Obstructive sleep apnea (adult) (pediatric): Secondary | ICD-10-CM | POA: Diagnosis not present

## 2018-12-07 DIAGNOSIS — Z719 Counseling, unspecified: Secondary | ICD-10-CM | POA: Diagnosis not present

## 2018-12-09 DIAGNOSIS — Z719 Counseling, unspecified: Secondary | ICD-10-CM | POA: Diagnosis not present

## 2018-12-16 DIAGNOSIS — Z719 Counseling, unspecified: Secondary | ICD-10-CM | POA: Diagnosis not present

## 2018-12-23 DIAGNOSIS — Z719 Counseling, unspecified: Secondary | ICD-10-CM | POA: Diagnosis not present

## 2018-12-30 DIAGNOSIS — Z719 Counseling, unspecified: Secondary | ICD-10-CM | POA: Diagnosis not present

## 2019-01-06 DIAGNOSIS — Z719 Counseling, unspecified: Secondary | ICD-10-CM | POA: Diagnosis not present

## 2019-02-02 ENCOUNTER — Ambulatory Visit
Admission: RE | Admit: 2019-02-02 | Discharge: 2019-02-02 | Disposition: A | Discharge status: Home / Self Care | Payer: 59 | Source: Ambulatory Visit | Attending: Obstetrics and Gynecology | Admitting: Obstetrics and Gynecology

## 2019-02-02 ENCOUNTER — Other Ambulatory Visit: Payer: Self-pay | Admitting: Internal Medicine

## 2019-02-02 ENCOUNTER — Other Ambulatory Visit: Payer: Self-pay

## 2019-02-02 DIAGNOSIS — R921 Mammographic calcification found on diagnostic imaging of breast: Secondary | ICD-10-CM

## 2019-08-10 ENCOUNTER — Other Ambulatory Visit: Payer: Self-pay

## 2019-08-10 ENCOUNTER — Ambulatory Visit
Admission: RE | Admit: 2019-08-10 | Discharge: 2019-08-10 | Disposition: A | Discharge status: Home / Self Care | Payer: 59 | Source: Ambulatory Visit | Attending: Internal Medicine | Admitting: Internal Medicine

## 2019-08-10 DIAGNOSIS — R921 Mammographic calcification found on diagnostic imaging of breast: Secondary | ICD-10-CM

## 2020-02-12 ENCOUNTER — Ambulatory Visit: Payer: 59 | Attending: Internal Medicine

## 2020-02-12 DIAGNOSIS — Z23 Encounter for immunization: Secondary | ICD-10-CM

## 2020-02-12 NOTE — Progress Notes (Signed)
   Covid-19 Vaccination Clinic  Name:  Leslie Burton    MRN: [id number] DOB: 04/19/1971  02/12/2020  [name] was observed post Covid-19 immunization for 15 minutes without incident. She was provided with Vaccine Information Sheet and instruction to access the V-Safe system.   [name] was instructed to call 911 with any severe reactions post vaccine: Marland Kitchen Difficulty breathing  . Swelling of face and throat  . A fast heartbeat  . A bad rash all over body  . Dizziness and weakness   Immunizations Administered    Name Date Dose VIS Date Route   Pfizer COVID-19 Vaccine 02/12/2020 10:46 AM 0.3 mL 10/22/2019 Intramuscular   Manufacturer: [employer], [employer]   Lot: [id number]   [place]: [id number]

## 2020-03-08 ENCOUNTER — Ambulatory Visit: Payer: 59 | Attending: Internal Medicine

## 2020-03-08 DIAGNOSIS — Z23 Encounter for immunization: Secondary | ICD-10-CM

## 2020-03-08 NOTE — Progress Notes (Signed)
   Covid-19 Vaccination Clinic  Name:  Leslie Burton    MRN: [id number] DOB: [birth year]  03/08/2020  [name] was observed post Covid-19 immunization for 15 minutes without incident. She was provided with Vaccine Information Sheet and instruction to access the V-Safe system.   [name] was instructed to call 911 with any severe reactions post vaccine: Marland Kitchen Difficulty breathing  . Swelling of face and throat  . A fast heartbeat  . A bad rash all over body  . Dizziness and weakness   Immunizations Administered    Name Date Dose VIS Date Route   Pfizer COVID-19 Vaccine 03/08/2020  4:17 PM 0.3 mL 01/05/2019 Intramuscular   Manufacturer: [place]   Lot: [id number]   [place]: [id number]

## 2020-07-25 ENCOUNTER — Other Ambulatory Visit: Payer: Self-pay | Admitting: Internal Medicine

## 2020-07-25 DIAGNOSIS — R921 Mammographic calcification found on diagnostic imaging of breast: Secondary | ICD-10-CM

## 2020-08-10 ENCOUNTER — Ambulatory Visit
Admission: RE | Admit: 2020-08-10 | Discharge: 2020-08-10 | Disposition: A | Discharge status: Home / Self Care | Payer: 59 | Source: Ambulatory Visit | Attending: Internal Medicine | Admitting: Internal Medicine

## 2020-08-10 ENCOUNTER — Other Ambulatory Visit: Payer: Self-pay

## 2020-08-10 DIAGNOSIS — R921 Mammographic calcification found on diagnostic imaging of breast: Secondary | ICD-10-CM

## 2021-03-26 ENCOUNTER — Ambulatory Visit (HOSPITAL_COMMUNITY)
Admission: EM | Admit: 2021-03-26 | Discharge: 2021-03-26 | Disposition: A | Discharge status: Home / Self Care | Payer: 59 | Attending: Student | Admitting: Student

## 2021-03-26 ENCOUNTER — Other Ambulatory Visit: Payer: Self-pay

## 2021-03-26 ENCOUNTER — Encounter (HOSPITAL_COMMUNITY): Payer: Self-pay

## 2021-03-26 DIAGNOSIS — M65342 Trigger finger, left ring finger: Secondary | ICD-10-CM

## 2021-03-26 DIAGNOSIS — R0789 Other chest pain: Secondary | ICD-10-CM

## 2021-03-26 DIAGNOSIS — S39012A Strain of muscle, fascia and tendon of lower back, initial encounter: Secondary | ICD-10-CM

## 2021-03-26 MED ORDER — TIZANIDINE HCL 2 MG PO CAPS: 2.0000 mg | ORAL_CAPSULE | Freq: Three times a day (TID) | ORAL | 0 refills | Status: AC
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
## 2021-03-26 NOTE — Discharge Instructions (Addendum)
-  Start the muscle relaxer-Zanaflex (tizanidine), up to 3 times daily for muscle spasms and pain.  This can make you drowsy, so take at bedtime or when you do not need to drive or operate machinery. -Take Tylenol 1000 mg 3 times daily, and ibuprofen 800 mg 3 times daily with food.  You can take these together, or alternate every 3-4 hours. -Use your finger brace while you are having the finger locking, for about 7 to 10 days.  If symptoms persist after that time, follow-up with orthopedics, information below. -Seek additional medical attention if you develop new symptoms like abdominal pain, change in bowel or bladder function, new pain, weakness, numbness in your arms or legs, etc.

## 2021-09-20 ENCOUNTER — Other Ambulatory Visit: Payer: Self-pay | Admitting: Internal Medicine

## 2021-09-20 DIAGNOSIS — Z1231 Encounter for screening mammogram for malignant neoplasm of breast: Secondary | ICD-10-CM

## 2021-10-23 ENCOUNTER — Ambulatory Visit: Payer: 59

## 2021-10-25 ENCOUNTER — Ambulatory Visit
Admission: RE | Admit: 2021-10-25 | Discharge: 2021-10-25 | Disposition: A | Discharge status: Home / Self Care | Payer: 59 | Source: Ambulatory Visit | Attending: Internal Medicine | Admitting: Internal Medicine

## 2021-10-25 DIAGNOSIS — Z1231 Encounter for screening mammogram for malignant neoplasm of breast: Secondary | ICD-10-CM

## 2021-10-26 ENCOUNTER — Other Ambulatory Visit: Payer: Self-pay | Admitting: Internal Medicine

## 2021-10-26 DIAGNOSIS — R928 Other abnormal and inconclusive findings on diagnostic imaging of breast: Secondary | ICD-10-CM

## 2021-11-30 ENCOUNTER — Ambulatory Visit
Admission: RE | Admit: 2021-11-30 | Discharge: 2021-11-30 | Disposition: A | Discharge status: Home / Self Care | Payer: 59 | Source: Ambulatory Visit | Attending: Internal Medicine | Admitting: Internal Medicine

## 2021-11-30 ENCOUNTER — Ambulatory Visit
Admission: RE | Admit: 2021-11-30 | Discharge: 2021-11-30 | Disposition: A | Discharge status: Home / Self Care | Payer: Self-pay | Source: Ambulatory Visit | Attending: Internal Medicine | Admitting: Internal Medicine

## 2021-11-30 DIAGNOSIS — R928 Other abnormal and inconclusive findings on diagnostic imaging of breast: Secondary | ICD-10-CM

## 2021-12-13 ENCOUNTER — Other Ambulatory Visit: Payer: 59

## 2022-04-21 ENCOUNTER — Encounter (HOSPITAL_BASED_OUTPATIENT_CLINIC_OR_DEPARTMENT_OTHER): Payer: Self-pay | Admitting: Obstetrics and Gynecology

## 2022-04-21 ENCOUNTER — Emergency Department (HOSPITAL_BASED_OUTPATIENT_CLINIC_OR_DEPARTMENT_OTHER): Payer: 59 | Admitting: Radiology

## 2022-04-21 ENCOUNTER — Emergency Department (HOSPITAL_BASED_OUTPATIENT_CLINIC_OR_DEPARTMENT_OTHER)
Admission: EM | Admit: 2022-04-21 | Discharge: 2022-04-21 | Disposition: A | Discharge status: Home / Self Care | Payer: 59 | Attending: Emergency Medicine | Admitting: Emergency Medicine

## 2022-04-21 ENCOUNTER — Other Ambulatory Visit: Payer: Self-pay

## 2022-04-21 DIAGNOSIS — S92355A Nondisplaced fracture of fifth metatarsal bone, left foot, initial encounter for closed fracture: Secondary | ICD-10-CM | POA: Diagnosis not present

## 2022-04-21 DIAGNOSIS — M79672 Pain in left foot: Secondary | ICD-10-CM | POA: Insufficient documentation

## 2022-04-21 DIAGNOSIS — S99922A Unspecified injury of left foot, initial encounter: Secondary | ICD-10-CM | POA: Diagnosis present

## 2022-04-21 DIAGNOSIS — X58XXXA Exposure to other specified factors, initial encounter: Secondary | ICD-10-CM | POA: Diagnosis not present

## 2022-04-21 MED ORDER — NAPROXEN 500 MG PO TABS: 500.0000 mg | ORAL_TABLET | Freq: Two times a day (BID) | ORAL | 0 refills | Status: AC

## 2022-04-21 NOTE — ED Notes (Signed)
Discharge paperwork given and understood. 

## 2022-04-21 NOTE — Discharge Instructions (Addendum)
You been provided the contact information for local orthopedic specialist by the name of Dr. Sherryle Lis.  Please call and schedule an appointment for reevaluation continue medical management within the next 3 to 5 days.  Do not bear any weight on this leg until you are able to follow-up with orthopedics.  Continue managing pain with anti-inflammatories and RICE therapy.  A prescription by the name naproxen has been sent to your pharmacy.  This is an anti-inflammatory medication.  Take 1 tablet every 12 hours for pain relief.  Always take with plenty of food and water.  Do not take Motrin or ibuprofen with this medication, stay on the same family.  You may though take Tylenol in addition to the naproxen.  Return to the ED for new or worsening symptoms as discussed.

## 2022-04-21 NOTE — ED Provider Notes (Signed)
[facility] EMERGENC[facility] DEPT Provider Note   CSN: [id number] Arrival date & time: 04/21/22  1411     History  Chief Complaint  Patient presents with   Foot Pain    Leslie Burton is a 51 y.o. female with chief complaint of left foot pain.  Was walking and rolled her ankle on a rock around noon today.  Felt immediate pain and began having difficulty bearing weight on the foot.  Denies numbness or tingling of the foot.  Denies open wound or discoloration of the area.  No Hx of prior injury or surgery to the foot.  Not on anticoagulation.  The history is provided by the patient and medical records.  Foot Pain       Home Medications Prior to Admission medications   Medication Sig Start Date End Date Taking? Authorizing Provider  naproxen (NAPROSYN) 500 MG tablet Take 1 tablet (500 mg total) by mouth 2 (two) times daily. 04/21/22  Yes Prince Rome, PA-C  tizanidine (ZANAFLEX) 2 MG capsule Take 1 capsule (2 mg total) by mouth 3 (three) times daily. 03/26/21   Hazel Sams, PA-C      Allergies    Patient has no known allergies.    Review of Systems   Review of Systems  Musculoskeletal:        Left foot pain    Physical Exam Updated Vital Signs BP 118/89 (BP Location: Left Arm)   Pulse 78   Temp 98.1 F (36.7 C)   Resp 18   Ht '5\' 5"'$  (1.651 m)   Wt 99.8 kg   SpO2 100%   BMI 36.61 kg/m  Physical Exam Vitals and nursing note reviewed.  Constitutional:      General: She is not in acute distress.    Appearance: She is well-developed.  HENT:     Head: Normocephalic and atraumatic.  Eyes:     Conjunctiva/sclera: Conjunctivae normal.  Cardiovascular:     Rate and Rhythm: Normal rate and regular rhythm.  Pulmonary:     Effort: Pulmonary effort is normal. No respiratory distress.     Breath sounds: Normal breath sounds.  Abdominal:     Palpations: Abdomen is soft.     Tenderness: There is no abdominal tenderness.  Musculoskeletal:         General: Signs of injury present. No swelling.     Cervical back: Neck supple.     Right lower leg: No edema.     Left lower leg: No edema.       Feet:  Feet:     Comments: Tenderness as depicted above.  Mild swelling of the area.  No pallor, ecchymosis, bruising, or erythema.  No tenderness to the surrounding tissue.  Mildly reduced range of motion of the fifth digit of the left foot, due to elicited tenderness.  CRT less than 2 seconds of lower extremity.  No obvious deformity or malalignment appreciated.  Sensation intact of bilateral lower extremities.  DP and PT pulses 2+ bilaterally. Skin:    General: Skin is warm and dry.     Capillary Refill: Capillary refill takes less than 2 seconds.  Neurological:     Mental Status: She is alert and oriented to person, place, and time.     Sensory: No sensory deficit.  Psychiatric:        Mood and Affect: Mood normal.     ED Results / Procedures / Treatments   Labs (all labs ordered are listed, but only  abnormal results are displayed) Labs Reviewed - No data to display  EKG None  Radiology DG Foot Complete Left  Result Date: 04/21/2022 CLINICAL DATA:  Left foot pain, status post trauma. EXAM: LEFT FOOT - COMPLETE 3+ VIEW COMPARISON:  None Available. FINDINGS: An acute, nondisplaced fracture is seen involving the base of the fifth left metatarsal. There is no evidence of dislocation. There is no evidence of arthropathy or other focal bone abnormality. Mild diffuse soft tissue swelling is noted. IMPRESSION: Acute, nondisplaced fracture of the base of the fifth left metatarsal. Electronically Signed   By: Virgina Norfolk M.D.   On: 04/21/2022 15:44    Procedures Procedures    Medications Ordered in ED Medications - No data to display  ED Course/ Medical Decision Making/ A&P                           Medical Decision Making Amount and/or Complexity of Data Reviewed External Data Reviewed: notes. Labs: ordered.  Decision-making details documented in ED Course. Radiology: ordered and independent interpretation performed. Decision-making details documented in ED Course. ECG/medicine tests: ordered and independent interpretation performed. Decision-making details documented in ED Course.  Risk OTC drugs. Prescription drug management.   51 y.o. female presents to the ED for concern of Foot Pain   This involves an extensive number of treatment options, and is a complaint that carries with it a high risk of complications and morbidity.  The emergent differential diagnosis prior to evaluation includes, but is not limited to: Sprain, fracture, dislocation, contusion  This is not an exhaustive differential.   Past Medical History / Co-morbidities / Social History: Hx of dysmenorrhea, hemorrhoids, menorrhagia, prior tubal ligation, prior hysterectomy, HSV-1 and HSV-2.  Physical Exam: Physical exam performed. The pertinent findings include: Tenderness and mild swelling of the left foot, in the anatomic location of the fifth metatarsal base.  Lab Tests: None  Imaging Studies: I ordered imaging studies including x-ray left foot.  I independently visualized and interpreted said imaging.  Pertinent results include: Acute, nondisplaced fracture of the base of the fifth left metatarsal I agree with the radiologist interpretation.  Medications: I have reviewed the patients home medicines and have made adjustments as needed  ED Course/Disposition: Pt well-appearing on exam.  Presenting with left lateral foot pain and swelling after rolling her foot on a rock.  Moderate tenderness on exam.  Patient X-Ray with evidence of acute nondisplaced fracture of the fifth left metatarsal base.  No malalignment.  Fracture is closed.  No physical exam evidence of vascular compromise, ischemia, or compartment syndrome.  Pain managed in ED.  Pt advised to follow up with orthopedics for further evaluation and treatment.   Patient given splint and crutches while in ED.  Recommend nonweightbearing status until able to follow-up with orthopedics.  Conservative therapy recommended and discussed.  Patient in NAD in good condition at time of discharge.  After consideration of the diagnostic results and the patient's encounter today, I feel that the emergency department workup does not suggest an emergent condition requiring admission or immediate intervention beyond what has been performed at this time.  The patient is safe for discharge and has been instructed to return immediately for worsening symptoms, change in symptoms or any other concerns.  Discussed course of treatment thoroughly with the patient, whom demonstrated understanding.  Patient in agreement and has no further questions.  I discussed this case with my attending physician Dr. Maryan Rued, who agreed  with the proposed treatment course and cosigned this note including patient's presenting symptoms, physical exam, and planned diagnostics and interventions.  Attending physician stated agreement with plan or made changes to plan which were implemented.     This chart was dictated using voice recognition software.  Despite best efforts to proofread, errors can occur which can change the documentation meaning.         Final Clinical Impression(s) / ED Diagnoses Final diagnoses:  Closed nondisplaced fracture of fifth metatarsal bone of left foot, initial encounter    Rx / DC Orders ED Discharge Orders          Ordered    naproxen (NAPROSYN) 500 MG tablet  2 times daily        04/21/22 1543              Prince Rome, PA-C 83/25/49 1628    Blanchie Dessert, MD 04/22/22 2128

## 2022-04-21 NOTE — ED Triage Notes (Signed)
Patient reports to the ER for left foot pain. Patient reports she stepped on a rock and her foot twisted. Denies fall or LOC

## 2022-04-22 ENCOUNTER — Telehealth: Payer: Self-pay | Admitting: *Deleted

## 2022-04-25 ENCOUNTER — Encounter: Payer: Self-pay | Admitting: Podiatry

## 2022-04-25 ENCOUNTER — Ambulatory Visit: Payer: 59 | Admitting: Podiatry

## 2022-04-25 DIAGNOSIS — S92352A Displaced fracture of fifth metatarsal bone, left foot, initial encounter for closed fracture: Secondary | ICD-10-CM | POA: Diagnosis not present

## 2022-04-25 DIAGNOSIS — T1490XA Injury, unspecified, initial encounter: Secondary | ICD-10-CM

## 2022-04-25 NOTE — Progress Notes (Signed)
  Subjective:  Patient ID: Leslie Burton, female    DOB: 1970/12/23,   MRN: [id number]  Chief Complaint  Patient presents with   Foot Pain     L foot fracture, top of foot near pinky toe    51 y.o. female presents for concern of  left foot fracture. On 04/21/22 she rolled her ankle on a rock and had immediate pain. She was seen in emergency department and X-rays were taken that showed fifth metatarsal base fracture. She was advised to be non-weightbearing and to follow-up in with [place] until then. Was placed in a splint. Denies any other pedal complaints. Denies n/v/f/c.   Past Medical History:  Diagnosis Date   Abnormal pap    BV (bacterial vaginosis)    Dysmenorrhea    Fibroids    Hemorrhoid    Hemorrhoids 2000   HPV (human papilloma virus) infection    HSV-1 infection    HSV-2 infection    Labial abscess 1999   right   Menorrhagia    Trichomonas    Vulvar lesion     Objective:  Physical Exam: Vascular: DP/PT pulses 2/4 bilateral. CFT <3 seconds. Normal hair growth on digits. No edema.  Skin. No lacerations or abrasions bilateral feet.  Musculoskeletal: MMT 5/5 bilateral lower extremities in DF, PF, Inversion and Eversion. Deceased ROM in DF of ankle joint. Exquisitely tender along fifth metatarsal base. Pain with eversion and PF. No pain distally or along peroneal tendons.  Neurological: Sensation intact to light touch.   Assessment:   1. Closed fracture of base of fifth metatarsal bone of left foot      Plan:  Patient was evaluated and treated and all questions answered. -Xrays reviewed. That show base of fifth metatarsal left non displaced fracture non articular however very close to articular surface.   -Discussed treatement options for Jone's fracture; risks, alternatives, and benefits explained.  -Dispensed CAM boot. Patient to wear at all times and instructed on use -Discussed with patient typically best course of action is to undergo surgery to fix  fracture however patient would like to avoid any surgery. Discussed non-weightbearing for a period of about 6 weeks to allow this fracture to heel and will reassess.  -Recommend protection, rest, ice, elevation daily until symptoms improve  -Rx pain med/antinflammatories as needed -Patient to return to office in 5 weeks for serial x-rays to assess healing  or sooner if condition worsens.   Lorenda Peck, DPM

## 2022-04-26 ENCOUNTER — Ambulatory Visit: Payer: BLUE CROSS/BLUE SHIELD | Admitting: Podiatry

## 2022-04-26 ENCOUNTER — Telehealth: Payer: Self-pay | Admitting: Podiatry

## 2022-04-26 NOTE — Telephone Encounter (Signed)
Pt called asking what should she wear at bed time, she wears the boot during the day.

## 2022-04-26 NOTE — Telephone Encounter (Signed)
Pt is aware of what you said and is stated that is a big boot to be wearing in the bed. She did mention something about if there was something not a big but then said she understood.

## 2022-04-26 NOTE — Telephone Encounter (Signed)
She should wear boot at night to help protect foot while sleeping and if need to get up in the middle of the night

## 2022-05-30 ENCOUNTER — Ambulatory Visit: Payer: 59 | Admitting: Podiatry

## 2022-05-30 ENCOUNTER — Other Ambulatory Visit: Payer: Self-pay | Admitting: Podiatry

## 2022-05-30 ENCOUNTER — Ambulatory Visit (INDEPENDENT_AMBULATORY_CARE_PROVIDER_SITE_OTHER): Payer: 59

## 2022-05-30 ENCOUNTER — Encounter: Payer: Self-pay | Admitting: Podiatry

## 2022-05-30 DIAGNOSIS — S92352A Displaced fracture of fifth metatarsal bone, left foot, initial encounter for closed fracture: Secondary | ICD-10-CM

## 2022-05-30 NOTE — Progress Notes (Signed)
  Subjective:  Patient ID: Leslie Burton, female    DOB: Jun 25, 1971,   MRN: [id number]  Chief Complaint  Patient presents with   Foot Injury       L foot fracture, top of foot near pinky toe      51 y.o. female presents for follow-up of left fifth metatarsal fracture. Relates the pain is improving. She is still taking pain medicaiton every now and then. Has been putting some weight on it in the boot when walking as she only uses one crutch.  Relates some swelling but it is improving.  Denies any other pedal complaints. Denies n/v/f/c.   Past Medical History:  Diagnosis Date   Abnormal pap    BV (bacterial vaginosis)    Dysmenorrhea    Fibroids    Hemorrhoid    Hemorrhoids 2000   HPV (human papilloma virus) infection    HSV-1 infection    HSV-2 infection    Labial abscess 1999   right   Menorrhagia    Trichomonas    Vulvar lesion     Objective:  Physical Exam: Vascular: DP/PT pulses 2/4 bilateral. CFT <3 seconds. Normal hair growth on digits. No edema.  Skin. No lacerations or abrasions bilateral feet.  Musculoskeletal: MMT 5/5 bilateral lower extremities in DF, PF, Inversion and Eversion. Deceased ROM in DF of ankle joint. Moderately  tender along fifth metatarsal base. Pain with eversion and PF. No pain distally or along peroneal tendons.  Neurological: Sensation intact to light touch.   Assessment:   1. Closed fracture of base of fifth metatarsal bone of left foot       Plan:  Patient was evaluated and treated and all questions answered. -Xrays reviewed. That show base of fifth metatarsal left non displaced fracture non articular however very close to articular surface. Appears to have some bony bridging noted.    -Discussed treatement options for Jone's fracture; risks, alternatives, and benefits explained.  -Dispensed CAM boot. Patient to wear at all times and instructed on use -Discussed with patient typically best course of action is to undergo  surgery to fix fracture however patient would like to avoid any surgery still. Discussed non-weightbearing for a period of about 3 more weeks to allow this fracture to heel and will reassess.  -Recommend protection, rest, ice, elevation daily until symptoms improve  -Rx pain med/antinflammatories as needed -Patient to return to office in 4 weeks for serial x-rays to assess healing  or sooner if condition worsens.   Lorenda Peck, DPM

## 2022-06-27 ENCOUNTER — Ambulatory Visit (INDEPENDENT_AMBULATORY_CARE_PROVIDER_SITE_OTHER): Payer: 59

## 2022-06-27 ENCOUNTER — Encounter: Payer: Self-pay | Admitting: Podiatry

## 2022-06-27 ENCOUNTER — Ambulatory Visit: Payer: 59 | Admitting: Podiatry

## 2022-06-27 DIAGNOSIS — S92352A Displaced fracture of fifth metatarsal bone, left foot, initial encounter for closed fracture: Secondary | ICD-10-CM

## 2022-06-27 NOTE — Progress Notes (Signed)
  Subjective:  Patient ID: Leslie Burton, female    DOB: Oct 29, 1971,   MRN: [id number]  Chief Complaint  Patient presents with   Foot Injury    Left foot pinky toe fracture    51 y.o. female presents for follow-up of left fifth metatarsal fracture. 9 weeks out from injury.  Relates the pain is improving. She has been able to get around in regular shoes without issues. Does get some occasional tenderness on the outside of the foot.  Relates some swelling but it is improving.  Denies any other pedal complaints. Denies n/v/f/c.   Past Medical History:  Diagnosis Date   Abnormal pap    BV (bacterial vaginosis)    Dysmenorrhea    Fibroids    Hemorrhoid    Hemorrhoids 2000   HPV (human papilloma virus) infection    HSV-1 infection    HSV-2 infection    Labial abscess 1999   right   Menorrhagia    Trichomonas    Vulvar lesion     Objective:  Physical Exam: Vascular: DP/PT pulses 2/4 bilateral. CFT <3 seconds. Normal hair growth on digits. No edema.  Skin. No lacerations or abrasions bilateral feet.  Musculoskeletal: MMT 5/5 bilateral lower extremities in DF, PF, Inversion and Eversion. Deceased ROM in DF of ankle joint. Minimally tender along fifth metatarsal base. No pain with eversion and PF. No pain distally or along peroneal tendons.  Neurological: Sensation intact to light touch.   Assessment:   1. Closed fracture of base of fifth metatarsal bone of left foot        Plan:  Patient was evaluated and treated and all questions answered. -Xrays reviewed. That show base of fifth metatarsal left non displaced fracture non articular however very close to articular surface. Appears to have some bony bridging noted.    -Discussed treatement options for Jone's fracture; risks, alternatives, and benefits explained.  -May continue WBAT in regular shoe.  -Discussed overtime bone will continue to grow in a change and as long as we are not having pain should be ok to  continue in regular shoes.  -Recommend protection, rest, ice, elevation daily until symptoms improve  -Rx pain med/antinflammatories as needed -Patient to return to office as needed.    Lorenda Peck, DPM

## 2022-08-08 NOTE — Telephone Encounter (Signed)
error 

## 2022-10-01 DIAGNOSIS — Z833 Family history of diabetes mellitus: Secondary | ICD-10-CM | POA: Diagnosis not present

## 2022-10-01 DIAGNOSIS — E669 Obesity, unspecified: Secondary | ICD-10-CM | POA: Diagnosis not present

## 2022-10-01 DIAGNOSIS — J309 Allergic rhinitis, unspecified: Secondary | ICD-10-CM | POA: Diagnosis not present

## 2022-10-01 DIAGNOSIS — Z8249 Family history of ischemic heart disease and other diseases of the circulatory system: Secondary | ICD-10-CM | POA: Diagnosis not present

## 2022-10-01 DIAGNOSIS — G8929 Other chronic pain: Secondary | ICD-10-CM | POA: Diagnosis not present

## 2022-10-01 DIAGNOSIS — Z6838 Body mass index (BMI) 38.0-38.9, adult: Secondary | ICD-10-CM | POA: Diagnosis not present

## 2022-10-01 DIAGNOSIS — I10 Essential (primary) hypertension: Secondary | ICD-10-CM | POA: Diagnosis not present

## 2022-12-09 ENCOUNTER — Other Ambulatory Visit: Payer: Self-pay | Admitting: Internal Medicine

## 2022-12-09 DIAGNOSIS — Z1231 Encounter for screening mammogram for malignant neoplasm of breast: Secondary | ICD-10-CM

## 2023-03-11 ENCOUNTER — Ambulatory Visit
Admission: RE | Admit: 2023-03-11 | Discharge: 2023-03-11 | Disposition: A | Discharge status: Home / Self Care | Payer: Self-pay | Source: Ambulatory Visit | Attending: Internal Medicine | Admitting: Internal Medicine

## 2023-03-11 DIAGNOSIS — Z1231 Encounter for screening mammogram for malignant neoplasm of breast: Secondary | ICD-10-CM

## 2023-11-02 IMAGING — US US BREAST*L* LIMITED INC AXILLA
1 series · 2 of 2 positions shown · non-contrast
Comparison: Previous exam(s).

CLINICAL DATA: Screening recall for possible left breast masses.

EXAM:
DIGITAL DIAGNOSTIC UNILATERAL LEFT MAMMOGRAM WITH TOMOSYNTHESIS AND
CAD; ULTRASOUND LEFT BREAST LIMITED
TECHNIQUE: Left digital diagnostic mammography and breast tomosynthesis was
performed. The images were evaluated with computer-aided detection.;
Targeted ultrasound examination of the left breast was performed.

[Series 1: us breast*left* limited inc axilla · 0.07mm/px · 2 of 2 slices shown]
[im 1/2]
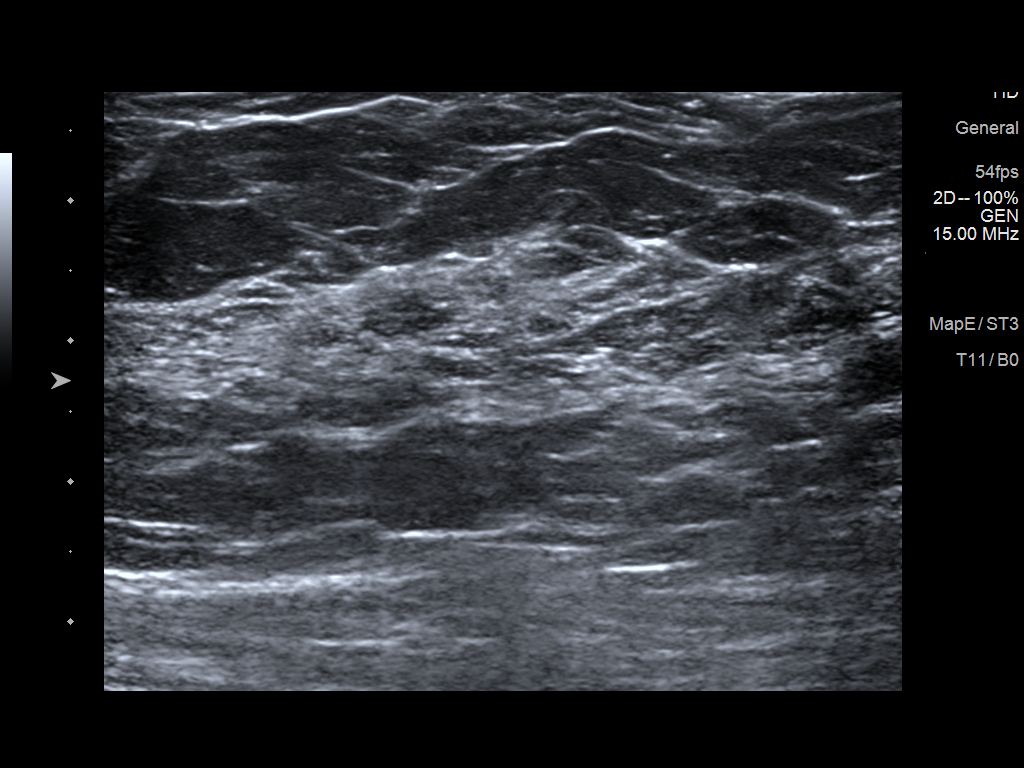
[im 2/2]
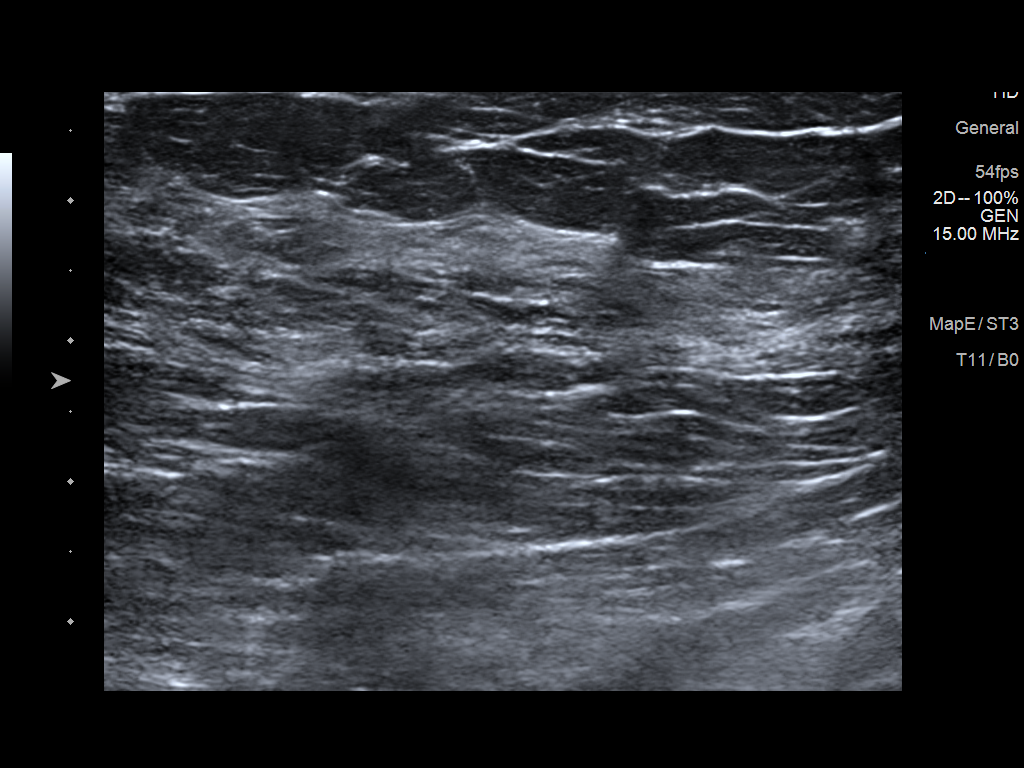

[2 of 2 positions shown; findings below may reference images not displayed]

ACR Breast Density Category c: The breast tissue is heterogeneously
dense, which may obscure small masses.
FINDINGS: On the diagnostic spot-compression images, the possible mass noted
in the medial posterior left breast current screening cc view
disperses consistent with superimposed fibroglandular tissue. Other
possible mass, also noted on the current screening cc view, mostly
disperses on spot compression imaging consistent with superimposed
fibroglandular tissue. Residual opacity projects in the posterior
and slightly lateral breast. There are stable benign calcifications.

Targeted ultrasound is performed, showing normal tissue. Imaging was
focused 2 to 4 o'clock position of the left breast, 1-2 cm the
nipple posterior depth to correspond to the questionable mass noted
on the screening exam.
IMPRESSION: No evidence of breast malignancy.

RECOMMENDATION:
Screening mammogram in one year.(Code:EJ-T-405)

I have discussed the findings and recommendations with the patient.
If applicable, a reminder letter will be sent to the patient
regarding the next appointment.

BI-RADS CATEGORY  2: Benign.

## 2023-11-02 IMAGING — MG MM DIGITAL DIAGNOSTIC UNILAT*L* W/ TOMO W/ CAD
4 series · 4 of 12 positions shown · non-contrast
Comparison: Previous exam(s).

CLINICAL DATA: Screening recall for possible left breast masses.

EXAM:
DIGITAL DIAGNOSTIC UNILATERAL LEFT MAMMOGRAM WITH TOMOSYNTHESIS AND
CAD; ULTRASOUND LEFT BREAST LIMITED
TECHNIQUE: Left digital diagnostic mammography and breast tomosynthesis was
performed. The images were evaluated with computer-aided detection.;
Targeted ultrasound examination of the left breast was performed.

[L ML synth-2D]
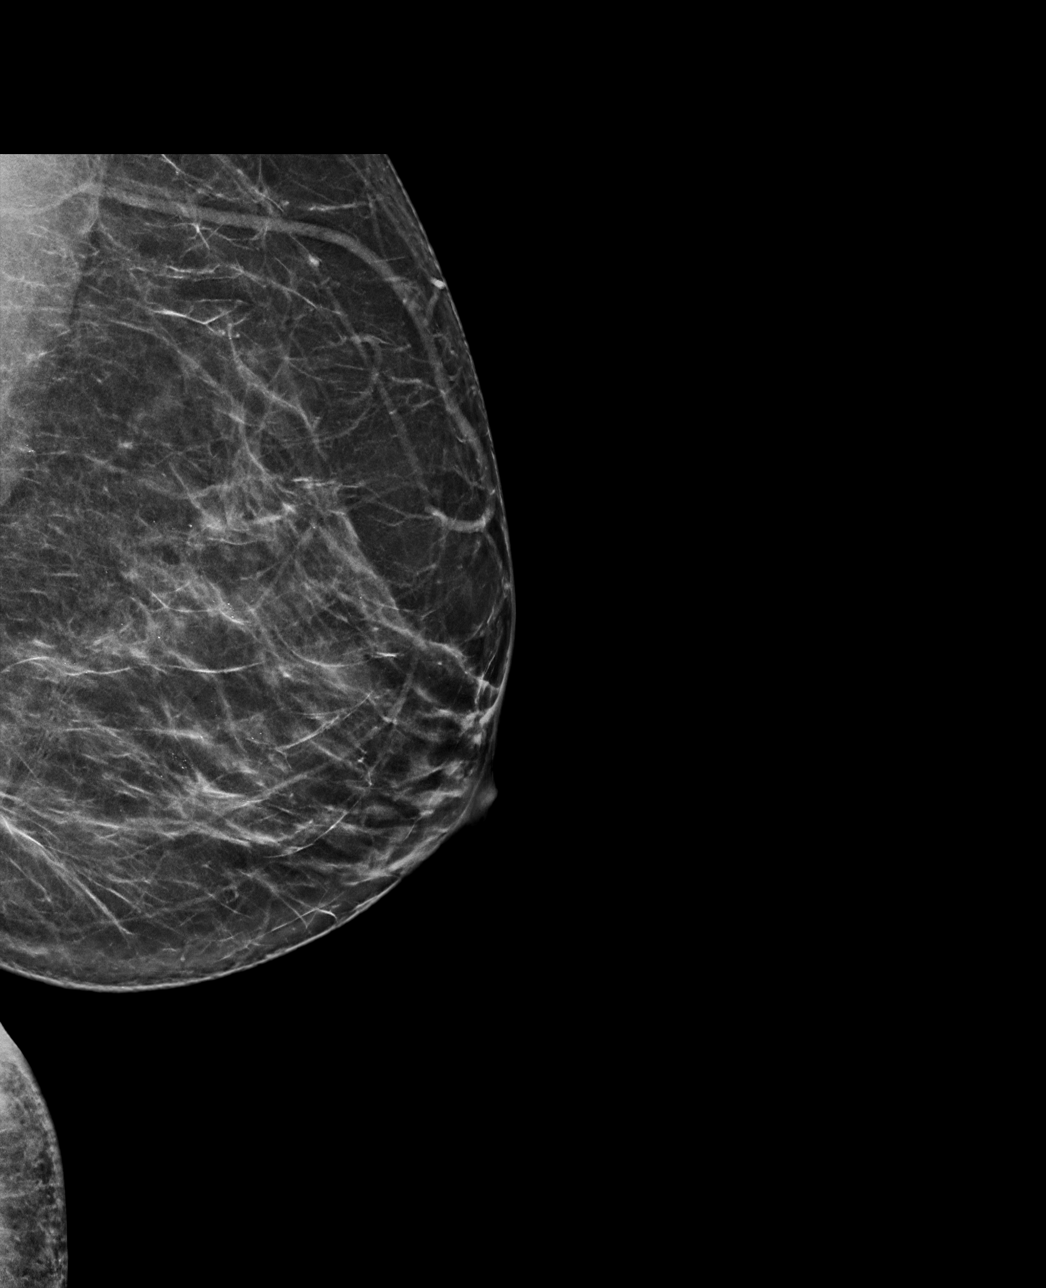

[L CC synth-2D]
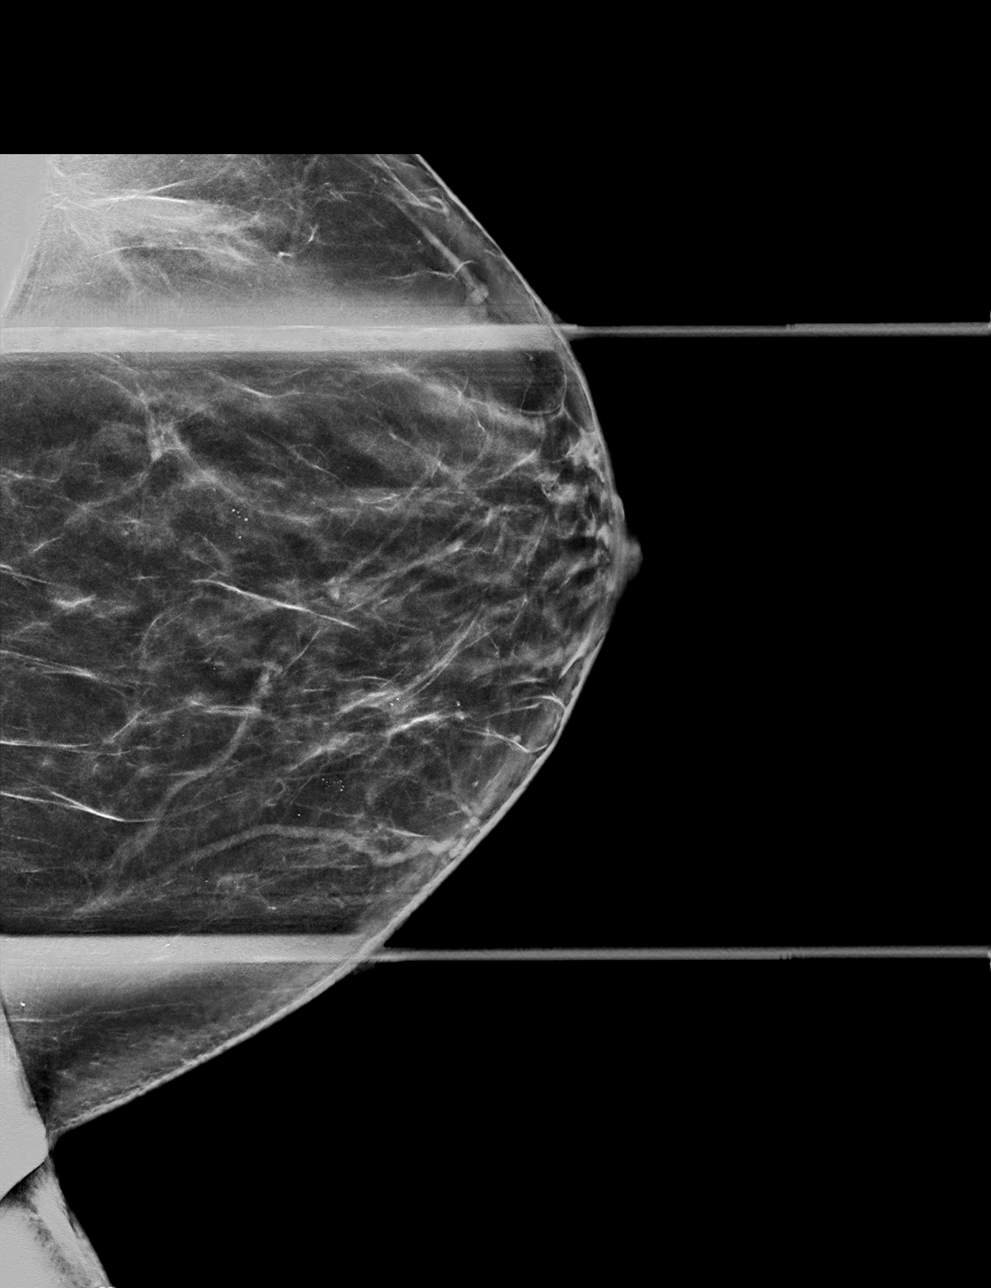

[L ML tomo · tomo slice 36/71.0]
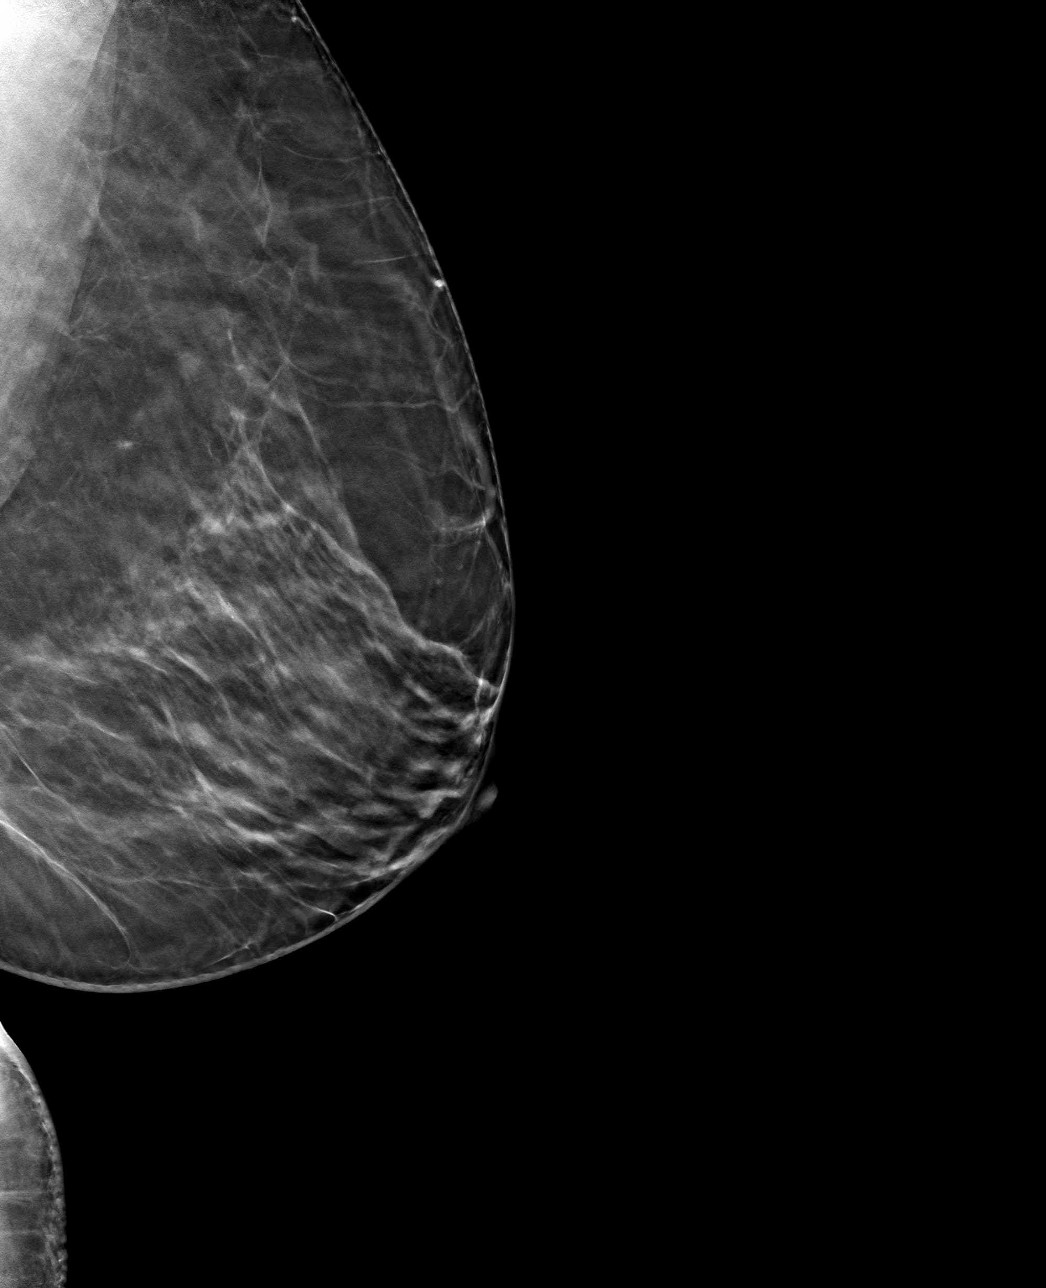

[L CC tomo · tomo slice 35/68.0]
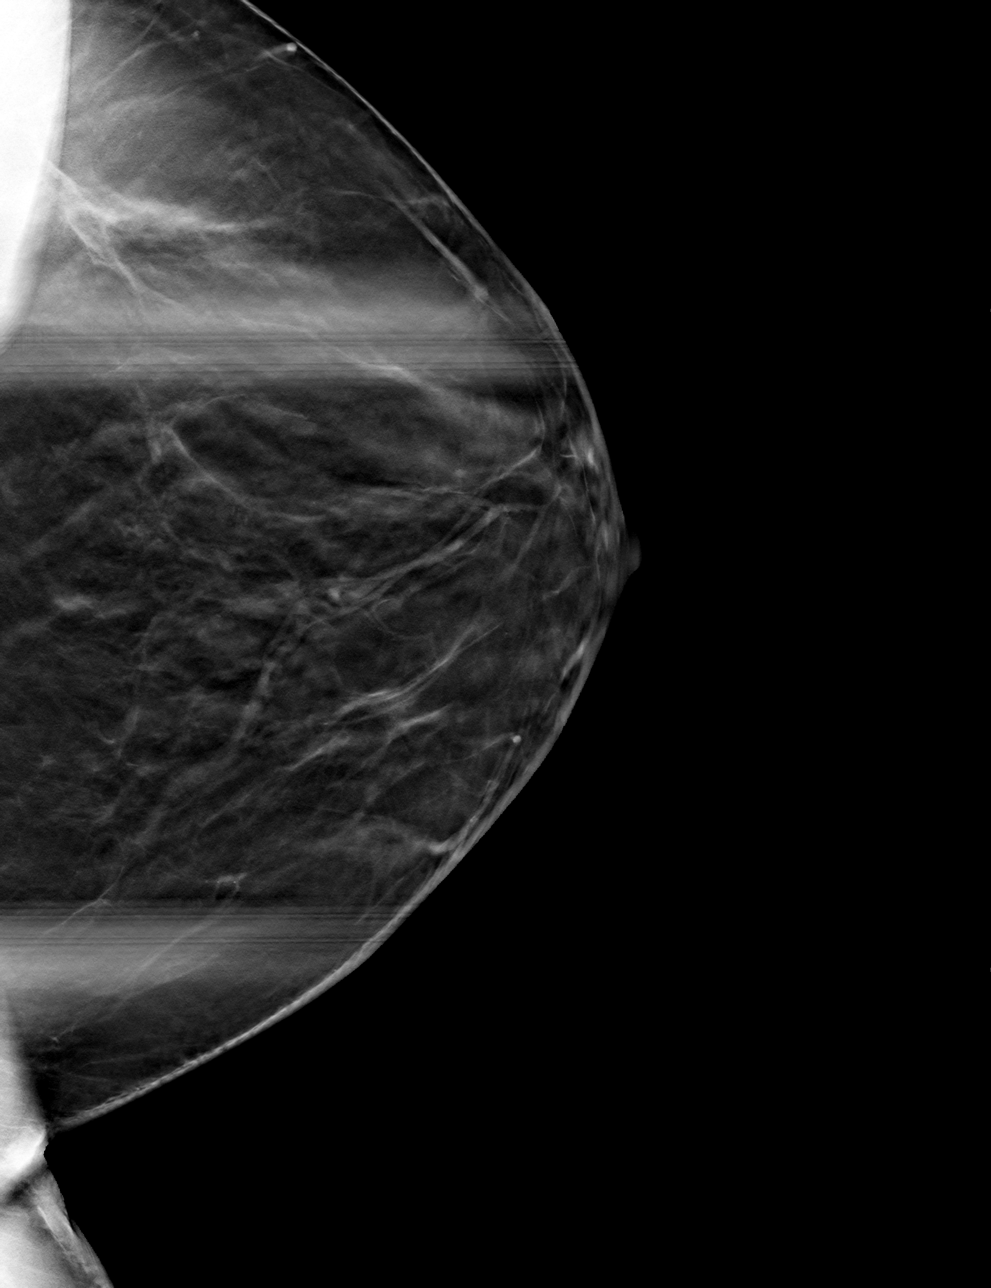

[4 of 12 positions shown; findings below may reference images not displayed]

ACR Breast Density Category c: The breast tissue is heterogeneously
dense, which may obscure small masses.
FINDINGS: On the diagnostic spot-compression images, the possible mass noted
in the medial posterior left breast current screening cc view
disperses consistent with superimposed fibroglandular tissue. Other
possible mass, also noted on the current screening cc view, mostly
disperses on spot compression imaging consistent with superimposed
fibroglandular tissue. Residual opacity projects in the posterior
and slightly lateral breast. There are stable benign calcifications.

Targeted ultrasound is performed, showing normal tissue. Imaging was
focused 2 to 4 o'clock position of the left breast, 1-2 cm the
nipple posterior depth to correspond to the questionable mass noted
on the screening exam.
IMPRESSION: No evidence of breast malignancy.

RECOMMENDATION:
Screening mammogram in one year.(Code:EJ-T-405)

I have discussed the findings and recommendations with the patient.
If applicable, a reminder letter will be sent to the patient
regarding the next appointment.

BI-RADS CATEGORY  2: Benign.
